# Patient Record
Sex: Female | Born: 1965 | Race: Black or African American | Hispanic: No | Marital: Single | Smoking: Current every day smoker
Health system: Southern US, Community
[De-identification: ages and names within clinical notes are randomized; demographics above are authoritative.]

## PROBLEM LIST (undated history)

## (undated) DIAGNOSIS — I10 Essential (primary) hypertension: Secondary | ICD-10-CM

## (undated) HISTORY — PX: TUBAL LIGATION: SHX77

---

## 2004-09-03 ENCOUNTER — Emergency Department: Payer: Self-pay | Admitting: Emergency Medicine

## 2004-10-25 ENCOUNTER — Emergency Department: Payer: Self-pay | Admitting: Unknown Physician Specialty

## 2005-03-01 ENCOUNTER — Emergency Department: Payer: Self-pay | Admitting: Emergency Medicine

## 2005-07-01 ENCOUNTER — Emergency Department: Payer: Self-pay | Admitting: Emergency Medicine

## 2005-10-24 ENCOUNTER — Emergency Department: Payer: Self-pay | Admitting: Emergency Medicine

## 2006-02-07 ENCOUNTER — Emergency Department: Payer: Self-pay | Admitting: Emergency Medicine

## 2006-11-20 ENCOUNTER — Emergency Department: Payer: Self-pay | Admitting: Emergency Medicine

## 2006-11-21 ENCOUNTER — Other Ambulatory Visit: Payer: Self-pay

## 2007-11-10 ENCOUNTER — Other Ambulatory Visit: Payer: Self-pay

## 2007-11-10 ENCOUNTER — Emergency Department: Payer: Self-pay | Admitting: Emergency Medicine

## 2008-10-19 ENCOUNTER — Emergency Department: Payer: Self-pay | Admitting: Emergency Medicine

## 2009-10-24 ENCOUNTER — Emergency Department: Payer: Self-pay | Admitting: Emergency Medicine

## 2010-03-12 ENCOUNTER — Emergency Department: Payer: Self-pay | Admitting: Emergency Medicine

## 2010-06-19 ENCOUNTER — Emergency Department: Payer: Self-pay | Admitting: Emergency Medicine

## 2010-10-18 ENCOUNTER — Emergency Department: Payer: Self-pay | Admitting: Emergency Medicine

## 2011-10-08 ENCOUNTER — Emergency Department: Payer: Self-pay | Admitting: Internal Medicine

## 2011-10-13 ENCOUNTER — Emergency Department: Payer: Self-pay | Admitting: Emergency Medicine

## 2011-12-24 ENCOUNTER — Emergency Department: Payer: Self-pay | Admitting: Internal Medicine

## 2012-04-14 ENCOUNTER — Emergency Department: Payer: Self-pay | Admitting: Internal Medicine

## 2013-01-18 ENCOUNTER — Emergency Department: Payer: Self-pay | Admitting: Emergency Medicine

## 2013-06-16 ENCOUNTER — Emergency Department: Payer: Self-pay | Admitting: Emergency Medicine

## 2013-06-16 LAB — RAPID INFLUENZA A&B ANTIGENS (ARMC ONLY)

## 2013-06-16 LAB — URINALYSIS, COMPLETE
BLOOD: NEGATIVE
Bacteria: NONE SEEN
Bilirubin,UR: NEGATIVE
Glucose,UR: NEGATIVE mg/dL (ref 0–75)
LEUKOCYTE ESTERASE: NEGATIVE
Nitrite: NEGATIVE
Ph: 5 (ref 4.5–8.0)
Protein: NEGATIVE
RBC,UR: 1 /HPF (ref 0–5)
SPECIFIC GRAVITY: 1.028 (ref 1.003–1.030)
Squamous Epithelial: 4
WBC UR: 3 /HPF (ref 0–5)

## 2014-02-12 ENCOUNTER — Emergency Department: Payer: Self-pay | Admitting: Internal Medicine

## 2014-02-22 ENCOUNTER — Emergency Department: Payer: Self-pay | Admitting: Emergency Medicine

## 2014-03-13 ENCOUNTER — Emergency Department: Payer: Self-pay | Admitting: Emergency Medicine

## 2014-03-13 LAB — BASIC METABOLIC PANEL
Anion Gap: 7 (ref 7–16)
BUN: 13 mg/dL (ref 7–18)
Calcium, Total: 8.5 mg/dL (ref 8.5–10.1)
Chloride: 105 mmol/L (ref 98–107)
Co2: 25 mmol/L (ref 21–32)
Creatinine: 1 mg/dL (ref 0.60–1.30)
EGFR (African American): >60
Glucose: 80 mg/dL (ref 65–99)
Osmolality: 273 (ref 275–301)
Potassium: 3.6 mmol/L (ref 3.5–5.1)
SODIUM: 137 mmol/L (ref 136–145)

## 2014-03-13 LAB — CBC
HCT: 36 % (ref 35.0–47.0)
HGB: 11 g/dL — ABNORMAL LOW (ref 12.0–16.0)
MCH: 25.3 pg — ABNORMAL LOW (ref 26.0–34.0)
MCHC: 30.7 g/dL — AB (ref 32.0–36.0)
MCV: 83 fL (ref 80–100)
Platelet: 207 10*3/uL (ref 150–440)
RBC: 4.36 10*6/uL (ref 3.80–5.20)
RDW: 14.9 % — ABNORMAL HIGH (ref 11.5–14.5)
WBC: 5.2 10*3/uL (ref 3.6–11.0)

## 2014-06-19 ENCOUNTER — Emergency Department: Payer: Self-pay | Admitting: Emergency Medicine

## 2014-06-19 LAB — URINALYSIS, COMPLETE
BILIRUBIN, UR: NEGATIVE
Bacteria: NONE SEEN
Blood: NEGATIVE
GLUCOSE, UR: NEGATIVE mg/dL (ref 0–75)
KETONE: NEGATIVE
Leukocyte Esterase: NEGATIVE
NITRITE: NEGATIVE
PROTEIN: NEGATIVE
Ph: 5 (ref 4.5–8.0)
RBC,UR: NONE SEEN /HPF (ref 0–5)
Specific Gravity: 1.016 (ref 1.003–1.030)
Squamous Epithelial: 1

## 2014-06-19 LAB — COMPREHENSIVE METABOLIC PANEL
ALBUMIN: 3.3 g/dL — AB (ref 3.4–5.0)
ALK PHOS: 161 U/L — AB
AST: 32 U/L (ref 15–37)
Anion Gap: 6 — ABNORMAL LOW (ref 7–16)
BUN: 12 mg/dL (ref 7–18)
Bilirubin,Total: 0.2 mg/dL (ref 0.2–1.0)
CALCIUM: 8.9 mg/dL (ref 8.5–10.1)
CHLORIDE: 104 mmol/L (ref 98–107)
Co2: 28 mmol/L (ref 21–32)
Creatinine: 0.96 mg/dL (ref 0.60–1.30)
EGFR (African American): >60
EGFR (Non-African Amer.): >60
Glucose: 92 mg/dL (ref 65–99)
Osmolality: 275 (ref 275–301)
Potassium: 3.8 mmol/L (ref 3.5–5.1)
SGPT (ALT): 41 U/L
Sodium: 138 mmol/L (ref 136–145)
TOTAL PROTEIN: 7.5 g/dL (ref 6.4–8.2)

## 2014-06-19 LAB — CBC
HCT: 38.6 % (ref 35.0–47.0)
HGB: 12.2 g/dL (ref 12.0–16.0)
MCH: 25.9 pg — ABNORMAL LOW (ref 26.0–34.0)
MCHC: 31.5 g/dL — ABNORMAL LOW (ref 32.0–36.0)
MCV: 82 fL (ref 80–100)
Platelet: 247 10*3/uL (ref 150–440)
RBC: 4.7 10*6/uL (ref 3.80–5.20)
RDW: 14.8 % — ABNORMAL HIGH (ref 11.5–14.5)
WBC: 5.9 10*3/uL (ref 3.6–11.0)

## 2014-06-19 LAB — PREGNANCY, URINE: Pregnancy Test, Urine: NEGATIVE m[IU]/mL

## 2014-06-19 LAB — LIPASE, BLOOD: Lipase: 216 U/L (ref 73–393)

## 2014-06-30 ENCOUNTER — Emergency Department: Payer: Self-pay | Admitting: Emergency Medicine

## 2015-01-08 ENCOUNTER — Emergency Department
Admission: EM | Admit: 2015-01-08 | Discharge: 2015-01-08 | Disposition: A | Discharge status: Home / Self Care | Payer: Self-pay | Attending: Emergency Medicine | Admitting: Emergency Medicine

## 2015-01-08 ENCOUNTER — Encounter: Payer: Self-pay | Admitting: Emergency Medicine

## 2015-01-08 DIAGNOSIS — R197 Diarrhea, unspecified: Secondary | ICD-10-CM

## 2015-01-08 DIAGNOSIS — Z3202 Encounter for pregnancy test, result negative: Secondary | ICD-10-CM | POA: Insufficient documentation

## 2015-01-08 DIAGNOSIS — Z720 Tobacco use: Secondary | ICD-10-CM | POA: Insufficient documentation

## 2015-01-08 DIAGNOSIS — R112 Nausea with vomiting, unspecified: Secondary | ICD-10-CM

## 2015-01-08 DIAGNOSIS — R1084 Generalized abdominal pain: Secondary | ICD-10-CM

## 2015-01-08 DIAGNOSIS — I10 Essential (primary) hypertension: Secondary | ICD-10-CM | POA: Insufficient documentation

## 2015-01-08 LAB — CBC WITH DIFFERENTIAL/PLATELET
Basophils Absolute: 0 10*3/uL (ref 0–0.1)
Basophils Relative: 1 %
EOS ABS: 0.1 10*3/uL (ref 0–0.7)
Eosinophils Relative: 3 %
HCT: 34.6 % — ABNORMAL LOW (ref 35.0–47.0)
HEMOGLOBIN: 11.2 g/dL — AB (ref 12.0–16.0)
LYMPHS PCT: 30 %
Lymphs Abs: 1.2 10*3/uL (ref 1.0–3.6)
MCH: 26.3 pg (ref 26.0–34.0)
MCHC: 32.5 g/dL (ref 32.0–36.0)
MCV: 80.9 fL (ref 80.0–100.0)
MONO ABS: 0.5 10*3/uL (ref 0.2–0.9)
Monocytes Relative: 13 %
NEUTROS PCT: 53 %
Neutro Abs: 2.1 10*3/uL (ref 1.4–6.5)
Platelets: 203 10*3/uL (ref 150–440)
RBC: 4.27 MIL/uL (ref 3.80–5.20)
RDW: 14.7 % — ABNORMAL HIGH (ref 11.5–14.5)
WBC: 4.1 10*3/uL (ref 3.6–11.0)

## 2015-01-08 LAB — URINALYSIS COMPLETE WITH MICROSCOPIC (ARMC ONLY)
BACTERIA UA: NONE SEEN
Bilirubin Urine: NEGATIVE
GLUCOSE, UA: NEGATIVE mg/dL
HGB URINE DIPSTICK: NEGATIVE
Ketones, ur: NEGATIVE mg/dL
Leukocytes, UA: NEGATIVE
NITRITE: NEGATIVE
PROTEIN: NEGATIVE mg/dL
Specific Gravity, Urine: 1.02 (ref 1.005–1.030)
pH: 5 (ref 5.0–8.0)

## 2015-01-08 LAB — POCT PREGNANCY, URINE: PREG TEST UR: NEGATIVE

## 2015-01-08 LAB — COMPREHENSIVE METABOLIC PANEL
ALK PHOS: 150 U/L — AB (ref 38–126)
ALT: 27 U/L (ref 14–54)
AST: 27 U/L (ref 15–41)
Albumin: 3.4 g/dL — ABNORMAL LOW (ref 3.5–5.0)
Anion gap: 5 (ref 5–15)
BILIRUBIN TOTAL: 0.3 mg/dL (ref 0.3–1.2)
BUN: 10 mg/dL (ref 6–20)
CHLORIDE: 106 mmol/L (ref 101–111)
CO2: 25 mmol/L (ref 22–32)
Calcium: 8.6 mg/dL — ABNORMAL LOW (ref 8.9–10.3)
Creatinine, Ser: 0.91 mg/dL (ref 0.44–1.00)
GFR calc non Af Amer: >60 mL/min (ref >60)
GLUCOSE: 95 mg/dL (ref 65–99)
Potassium: 3.8 mmol/L (ref 3.5–5.1)
SODIUM: 136 mmol/L (ref 135–145)
Total Protein: 7 g/dL (ref 6.5–8.1)

## 2015-01-08 MED ORDER — ONDANSETRON HCL 4 MG PO TABS: 4.0000 mg | ORAL_TABLET | Freq: Every day | ORAL | Status: DC | PRN

## 2015-01-08 MED ORDER — DICYCLOMINE HCL 20 MG PO TABS: 20.0000 mg | ORAL_TABLET | Freq: Three times a day (TID) | ORAL | Status: DC | PRN

## 2015-01-08 NOTE — Discharge Instructions (Signed)
Abdominal Pain °Many things can cause abdominal pain. Usually, abdominal pain is not caused by a disease and will improve without treatment. It can often be observed and treated at home. Your health care provider will do a physical exam and possibly order blood tests and X-rays to help determine the seriousness of your pain. However, in many cases, more time must pass before a clear cause of the pain can be found. Before that point, your health care provider may not know if you need more testing or further treatment. °HOME CARE INSTRUCTIONS  °Monitor your abdominal pain for any changes. The following actions may help to alleviate any discomfort you are experiencing: °· Only take over-the-counter or prescription medicines as directed by your health care provider. °· Do not take laxatives unless directed to do so by your health care provider. °· Try a clear liquid diet (broth, tea, or water) as directed by your health care provider. Slowly move to a bland diet as tolerated. °SEEK MEDICAL CARE IF: °· You have unexplained abdominal pain. °· You have abdominal pain associated with nausea or diarrhea. °· You have pain when you urinate or have a bowel movement. °· You experience abdominal pain that wakes you in the night. °· You have abdominal pain that is worsened or improved by eating food. °· You have abdominal pain that is worsened with eating fatty foods. °· You have a fever. °SEEK IMMEDIATE MEDICAL CARE IF:  °· Your pain does not go away within 2 hours. °· You keep throwing up (vomiting). °· Your pain is felt only in portions of the abdomen, such as the right side or the left lower portion of the abdomen. °· You pass bloody or black tarry stools. °MAKE SURE YOU: °· Understand these instructions.   °· Will watch your condition.   °· Will get help right away if you are not doing well or get worse.   °Document Released: 03/12/2005 Document Revised: 06/07/2013 Document Reviewed: 02/09/2013 °[employer]® Patient Information  ©2015 [employer]. This information is not intended to replace advice given to you by your health care provider. Make sure you discuss any questions you have with your health care provider. ° °Diarrhea °Diarrhea is frequent loose and watery bowel movements. It can cause you to feel weak and dehydrated. Dehydration can cause you to become tired and thirsty, have a dry mouth, and have decreased urination that often is dark yellow. Diarrhea is a sign of another problem, most often an infection that will not last long. In most cases, diarrhea typically lasts 2-3 days. However, it can last longer if it is a sign of something more serious. It is important to treat your diarrhea as directed by your caregiver to lessen or prevent future episodes of diarrhea. °CAUSES  °Some common causes include: °· Gastrointestinal infections caused by viruses, bacteria, or parasites. °· Food poisoning or food allergies. °· Certain medicines, such as antibiotics, chemotherapy, and laxatives. °· Artificial sweeteners and fructose. °· Digestive disorders. °HOME CARE INSTRUCTIONS °· Ensure adequate fluid intake (hydration): Have 1 cup (8 oz) of fluid for each diarrhea episode. Avoid fluids that contain simple sugars or sports drinks, fruit juices, whole milk products, and sodas. Your urine should be clear or pale yellow if you are drinking enough fluids. Hydrate with an oral rehydration solution that you can purchase at pharmacies, retail stores, and online. You can prepare an oral rehydration solution at home by mixing the following ingredients together: °·  - tsp table salt. °· ¾ tsp baking soda. °·    tsp salt substitute containing potassium chloride. °· 1  tablespoons sugar. °· 1 L (34 oz) of water. °· Certain foods and beverages may increase the speed at which food moves through the gastrointestinal (GI) tract. These foods and beverages should be avoided and include: °· Caffeinated and alcoholic beverages. °· High-fiber foods, such as raw  fruits and vegetables, nuts, seeds, and whole grain breads and cereals. °· Foods and beverages sweetened with sugar alcohols, such as xylitol, sorbitol, and mannitol. °· Some foods may be well tolerated and may help thicken stool including: °· Starchy foods, such as rice, toast, pasta, low-sugar cereal, oatmeal, grits, baked potatoes, crackers, and bagels. °· Bananas. °· Applesauce. °· Add probiotic-rich foods to help increase healthy bacteria in the GI tract, such as yogurt and fermented milk products. °· Wash your hands well after each diarrhea episode. °· Only take over-the-counter or prescription medicines as directed by your caregiver. °· Take a warm bath to relieve any burning or pain from frequent diarrhea episodes. °SEEK IMMEDIATE MEDICAL CARE IF:  °· You are unable to keep fluids down. °· You have persistent vomiting. °· You have blood in your stool, or your stools are black and tarry. °· You do not urinate in 6-8 hours, or there is only a small amount of very dark urine. °· You have abdominal pain that increases or localizes. °· You have weakness, dizziness, confusion, or light-headedness. °· You have a severe headache. °· Your diarrhea gets worse or does not get better. °· You have a fever or persistent symptoms for more than 2-3 days. °· You have a fever and your symptoms suddenly get worse. °MAKE SURE YOU:  °· Understand these instructions. °· Will watch your condition. °· Will get help right away if you are not doing well or get worse. °Document Released: 05/23/2002 Document Revised: 10/17/2013 Document Reviewed: 02/08/2012 °Exit[employer]® Patient Information ©2015 [employer]. This information is not intended to replace advice given to you by your health care provider. Make sure you discuss any questions you have with your health care provider. ° °Nausea and Vomiting °Nausea means you feel sick to your stomach. Throwing up (vomiting) is a reflex where stomach contents come out of your mouth. °HOME CARE    °· Take medicine as told by your doctor. °· Do not force yourself to eat. However, you do need to drink fluids. °· If you feel like eating, eat a normal diet as told by your doctor. °¨ Eat rice, wheat, potatoes, bread, lean meats, yogurt, fruits, and vegetables. °¨ Avoid high-fat foods. °· Drink enough fluids to keep your pee (urine) clear or pale yellow. °· Ask your doctor how to replace body fluid losses (rehydrate). Signs of body fluid loss (dehydration) include: °¨ Feeling very thirsty. °¨ Dry lips and mouth. °¨ Feeling dizzy. °¨ Dark pee. °¨ Peeing less than normal. °¨ Feeling confused. °¨ Fast breathing or heart rate. °GET HELP RIGHT AWAY IF:  °· You have blood in your throw up. °· You have black or bloody poop (stool). °· You have a bad headache or stiff neck. °· You feel confused. °· You have bad belly (abdominal) pain. °· You have chest pain or trouble breathing. °· You do not pee at least once every 8 hours. °· You have cold, clammy skin. °· You keep throwing up after 24 to 48 hours. °· You have a fever. °MAKE SURE YOU:  °· Understand these instructions. °· Will watch your condition. °· Will get help right away if you   are not doing well or get worse. °Document Released: 11/19/2007 Document Revised: 08/25/2011 Document Reviewed: 11/01/2010 °[employer]® Patient Information ©2015 [employer]. This information is not intended to replace advice given to you by your health care provider. Make sure you discuss any questions you have with your health care provider. ° °

## 2015-01-08 NOTE — ED Notes (Signed)

## 2015-01-08 NOTE — ED Notes (Signed)
Reports "eating some bad ranch dressing last night"  C/o diarrhea and nausea and abd cramps since.

## 2015-01-08 NOTE — ED Provider Notes (Addendum)
[facility] Emergency Department Provider Note  ____________________________________________  Time seen: Approximately 645 PM  I have reviewed the triage vital signs and the nursing notes.   HISTORY  Chief Complaint Abdominal Pain    HPI Debra Owen is a 49 y.o. female with a history of borderline hypertension who presents today with nausea vomiting diarrhea and abdominal cramping. She says that she had eaten a container of Ranch dressing last night that a bit in the refrigerator for 2 days. She said she got out of a restaurant. Nobody else at home at 8 in the [place] stressing. She said that she began having abdominal cramping. Had multiple episodes of vomiting. Also multiple episodes of diarrhea. Said that she had diffuse abdominal cramping with each episode of vomiting and diarrhea. Now she says that she is no longer having nausea vomiting and diarrhea. However, she says that she still has a lot of gas. Denies chest pain or shortness of breath. Says that she is concerned now because she feels weak. Also says that she is to work a double shift tonight and is concerned she will not be able to do so because of her weakness.   History reviewed. No pertinent past medical history.  There are no active problems to display for this patient.   History reviewed. No pertinent past surgical history.  No current outpatient prescriptions on file.  Allergies Review of patient's allergies indicates no known allergies.  History reviewed. No pertinent family history.  Social History History  Substance Use Topics  . Smoking status: Never Smoker   . Smokeless tobacco: Not on file  . Alcohol Use: No    Review of Systems Constitutional: No fever/chills Eyes: No visual changes. ENT: No sore throat. Cardiovascular: Denies chest pain. Respiratory: Denies shortness of breath. Gastrointestinal: No constipation. Genitourinary: Negative for dysuria. Musculoskeletal:  Negative for back pain. Skin: Negative for rash. Neurological: Negative for headaches, focal weakness or numbness.  10-point ROS otherwise negative.  ____________________________________________   PHYSICAL EXAM:  VITAL SIGNS: ED Triage Vitals  Enc Vitals Group     BP 01/08/15 1622 127/100 mmHg     Pulse Rate 01/08/15 1622 81     Resp 01/08/15 1622 20     Temp 01/08/15 1622 98.6 F (37 C)     Temp Source 01/08/15 1622 Oral     SpO2 01/08/15 1622 99 %     Weight 01/08/15 1622 300 lb (136.079 kg)     Height 01/08/15 1622 5\' 5"  (1.651 m)     Head Cir --      Peak Flow --      Pain Score 01/08/15 1623 4     Pain Loc --      Pain Edu? --      Excl. in GC? --     Constitutional: Alert and oriented. Well appearing and in no acute distress. Eyes: Conjunctivae are normal. PERRL. EOMI. Head: Atraumatic. Nose: No congestion/rhinnorhea. Mouth/Throat: Mucous membranes are moist.  Oropharynx non-erythematous. Neck: No stridor.   Cardiovascular: Normal rate, regular rhythm. Grossly normal heart sounds.  Good peripheral circulation. Respiratory: Normal respiratory effort.  No retractions. Lungs CTAB. Gastrointestinal: Soft with mild diffuse abdominal tenderness. No distention. No abdominal bruits. No CVA tenderness. Musculoskeletal: No lower extremity tenderness nor edema.  No joint effusions. Neurologic:  Normal speech and language. No gross focal neurologic deficits are appreciated. No gait instability. Skin:  Skin is warm, dry and intact. No rash noted. Psychiatric: Mood and affect are  normal. Speech and behavior are normal.  ____________________________________________   LABS (all labs ordered are listed, but only abnormal results are displayed)  Labs Reviewed  CBC WITH DIFFERENTIAL/PLATELET - Abnormal; Notable for the following:    Hemoglobin 11.2 (*)    HCT 34.6 (*)    RDW 14.7 (*)    All other components within normal limits  COMPREHENSIVE METABOLIC PANEL - Abnormal;  Notable for the following:    Calcium 8.6 (*)    Albumin 3.4 (*)    Alkaline Phosphatase 150 (*)    All other components within normal limits  URINALYSIS COMPLETEWITH MICROSCOPIC ([facility] ONLY) - Abnormal; Notable for the following:    Color, Urine YELLOW (*)    APPearance CLEAR (*)    Squamous Epithelial / LPF 0-5 (*)    All other components within normal limits  POCT PREGNANCY, URINE   ____________________________________________  EKG   ____________________________________________  RADIOLOGY   ____________________________________________   PROCEDURES   ____________________________________________   INITIAL IMPRESSION / ASSESSMENT AND PLAN / ED COURSE  Pertinent labs & imaging results that were available during my care of the patient were reviewed by me and considered in my medical decision making (see chart for details).  Patient with symptoms improving as far as her pain, nausea and vomiting and diarrhea. Feel that this may be an episode of food poisoning with improving symptoms. However, the patient does seem to be weak after this episode.  Despite this, she has reassuring labs. We'll discharge home with symptomatic treatment with Zofran and Bentyl. ____________________________________________   FINAL CLINICAL IMPRESSION(S) / ED DIAGNOSES  Acute abdominal pain with nausea vomiting and diarrhea. Initial visit.    Myrna Blazer, MD 01/08/15 [phone]  Patient knows to return for any worsening symptoms. However this time her symptoms are improving and her white count is normal. Will not proceed any further diagnostic imaging.  Myrna Blazer, MD 01/08/15 [phone]

## 2015-01-10 ENCOUNTER — Emergency Department
Admission: EM | Admit: 2015-01-10 | Discharge: 2015-01-10 | Disposition: A | Discharge status: Home / Self Care | Payer: Self-pay | Attending: Emergency Medicine | Admitting: Emergency Medicine

## 2015-01-10 ENCOUNTER — Encounter: Payer: Self-pay | Admitting: *Deleted

## 2015-01-10 DIAGNOSIS — K029 Dental caries, unspecified: Secondary | ICD-10-CM | POA: Insufficient documentation

## 2015-01-10 DIAGNOSIS — Z720 Tobacco use: Secondary | ICD-10-CM | POA: Insufficient documentation

## 2015-01-10 DIAGNOSIS — K088 Other specified disorders of teeth and supporting structures: Secondary | ICD-10-CM | POA: Insufficient documentation

## 2015-01-10 DIAGNOSIS — K0889 Other specified disorders of teeth and supporting structures: Secondary | ICD-10-CM

## 2015-01-10 DIAGNOSIS — I10 Essential (primary) hypertension: Secondary | ICD-10-CM | POA: Insufficient documentation

## 2015-01-10 HISTORY — DX: Essential (primary) hypertension: I10

## 2015-01-10 MED ORDER — IBUPROFEN 800 MG PO TABS: 800.0000 mg | ORAL_TABLET | Freq: Three times a day (TID) | ORAL | Status: DC | PRN

## 2015-01-10 MED ORDER — PENICILLIN V POTASSIUM 250 MG PO TABS: 250.0000 mg | ORAL_TABLET | Freq: Four times a day (QID) | ORAL | Status: DC

## 2015-01-10 NOTE — ED Notes (Signed)
Pt reports upper front dental pain for about 2 days, has been taking otc ibuprofen without relief.

## 2015-01-10 NOTE — ED Provider Notes (Signed)
[facility] Emergency Department Provider Note     Time seen: ----------------------------------------- 6:38 PM on 01/10/2015 -----------------------------------------    I have reviewed the triage vital signs and the nursing notes.   HISTORY  Chief Complaint Dental Pain    HPI BETTE BRIENZA is a 49 y.o. female who presents to ER for toothache for the last 2 days. Patient's been taking over-the-counter ibuprofen without relief. Patient states she's had a fever, denies other complaints.   Past Medical History  Diagnosis Date  . Hypertension     There are no active problems to display for this patient.   Past Surgical History  Procedure Laterality Date  . Cesarean section      Allergies Sulfa antibiotics  Social History History  Substance Use Topics  . Smoking status: Current Every Day Smoker  . Smokeless tobacco: Not on file  . Alcohol Use: No    Review of Systems Constitutional: Positive for fever Eyes: Negative for visual changes. ENT: Positive for toothache  ____________________________________________   PHYSICAL EXAM:  VITAL SIGNS: ED Triage Vitals  Enc Vitals Group     BP 01/10/15 1816 153/94 mmHg     Pulse --      Resp --      Temp 01/10/15 1816 97.7 F (36.5 C)     Temp Source 01/10/15 1815 Oral     SpO2 01/10/15 1816 95 %     Weight 01/10/15 1816 300 lb (136.079 kg)     Height 01/10/15 1816  (1.651 m)     Head Cir --      Peak Flow --      Pain Score 01/10/15 1818 7     Pain Loc --      Pain Edu? --      Excl. in GC? --     Constitutional: Alert and oriented. Well appearing and in no distress. Eyes: Conjunctivae are normal. PERRL. Normal extraocular movements. ENT   Head: Normocephalic and atraumatic.   Nose: No congestion/rhinnorhea.   Mouth/Throat: Mucous membranes are moist. Dental caries are present. Tooth #5 is tender   Neck: No stridor. Hematological/Lymphatic/Immunilogical: No  cervical lymphadenopathy.  ED COURSE:  Pertinent labs & imaging results that were available during my care of the patient were reviewed by me and considered in my medical decision making (see chart for details).  ___________________________________________  FINAL ASSESSMENT AND PLAN  Toothache  Plan: Patient with labs and imaging as dictated above. She'll be discharged with Pen-Vee K and Motrin to take at home. She is stable for discharge.   Emily Filbert, MD   Emily Filbert, MD 01/10/15 [phone]

## 2015-01-10 NOTE — Discharge Instructions (Signed)

## 2015-02-11 ENCOUNTER — Encounter: Payer: Self-pay | Admitting: Emergency Medicine

## 2015-02-11 ENCOUNTER — Emergency Department
Admission: EM | Admit: 2015-02-11 | Discharge: 2015-02-11 | Disposition: A | Discharge status: Home / Self Care | Payer: Self-pay | Attending: Emergency Medicine | Admitting: Emergency Medicine

## 2015-02-11 ENCOUNTER — Other Ambulatory Visit: Payer: Self-pay

## 2015-02-11 DIAGNOSIS — R51 Headache: Secondary | ICD-10-CM | POA: Insufficient documentation

## 2015-02-11 DIAGNOSIS — I10 Essential (primary) hypertension: Secondary | ICD-10-CM | POA: Insufficient documentation

## 2015-02-11 DIAGNOSIS — R519 Headache, unspecified: Secondary | ICD-10-CM

## 2015-02-11 DIAGNOSIS — R2243 Localized swelling, mass and lump, lower limb, bilateral: Secondary | ICD-10-CM | POA: Insufficient documentation

## 2015-02-11 DIAGNOSIS — R42 Dizziness and giddiness: Secondary | ICD-10-CM | POA: Insufficient documentation

## 2015-02-11 DIAGNOSIS — Z720 Tobacco use: Secondary | ICD-10-CM | POA: Insufficient documentation

## 2015-02-11 LAB — TROPONIN I: Troponin I: <0.03 ng/mL (ref <0.031)

## 2015-02-11 LAB — CBC
HCT: 34.2 % — ABNORMAL LOW (ref 35.0–47.0)
Hemoglobin: 11 g/dL — ABNORMAL LOW (ref 12.0–16.0)
MCH: 25.9 pg — AB (ref 26.0–34.0)
MCHC: 32.1 g/dL (ref 32.0–36.0)
MCV: 80.8 fL (ref 80.0–100.0)
Platelets: 219 10*3/uL (ref 150–440)
RBC: 4.23 MIL/uL (ref 3.80–5.20)
RDW: 14.8 % — AB (ref 11.5–14.5)
WBC: 5 10*3/uL (ref 3.6–11.0)

## 2015-02-11 LAB — BASIC METABOLIC PANEL
Anion gap: 10 (ref 5–15)
BUN: 18 mg/dL (ref 6–20)
CHLORIDE: 103 mmol/L (ref 101–111)
CO2: 23 mmol/L (ref 22–32)
CREATININE: 1.07 mg/dL — AB (ref 0.44–1.00)
Calcium: 9 mg/dL (ref 8.9–10.3)
GFR calc Af Amer: >60 mL/min (ref >60)
GFR calc non Af Amer: >60 mL/min (ref >60)
GLUCOSE: 166 mg/dL — AB (ref 65–99)
Potassium: 3.6 mmol/L (ref 3.5–5.1)
Sodium: 136 mmol/L (ref 135–145)

## 2015-02-11 LAB — GLUCOSE, CAPILLARY: Glucose-Capillary: 158 mg/dL — ABNORMAL HIGH (ref 65–99)

## 2015-02-11 MED ORDER — KETOROLAC TROMETHAMINE 30 MG/ML IJ SOLN
30.0000 mg | Freq: Once | INTRAMUSCULAR | Status: AC
  Administered 2015-02-11: 30 mg via INTRAVENOUS
  Filled 2015-02-11: qty 1

## 2015-02-11 MED ORDER — SODIUM CHLORIDE 0.9 % IV SOLN
Freq: Once | INTRAVENOUS | Status: AC
  Administered 2015-02-11: 19:00:00 via INTRAVENOUS

## 2015-02-11 MED ORDER — METOCLOPRAMIDE HCL 5 MG/ML IJ SOLN
10.0000 mg | Freq: Once | INTRAMUSCULAR | Status: AC
  Administered 2015-02-11: 10 mg via INTRAVENOUS
  Filled 2015-02-11: qty 2

## 2015-02-11 MED ORDER — DIPHENHYDRAMINE HCL 50 MG/ML IJ SOLN
25.0000 mg | Freq: Once | INTRAMUSCULAR | Status: AC
  Administered 2015-02-11: 25 mg via INTRAVENOUS
  Filled 2015-02-11: qty 1

## 2015-02-11 NOTE — Discharge Instructions (Signed)
Dizziness °Dizziness is a common problem. It is a feeling of unsteadiness or light-headedness. You may feel like you are about to faint. Dizziness can lead to injury if you stumble or fall. A person of any age group can suffer from dizziness, but dizziness is more common in older adults. °CAUSES  °Dizziness can be caused by many different things, including: °· Middle ear problems. °· Standing for too long. °· Infections. °· An allergic reaction. °· Aging. °· An emotional response to something, such as the sight of blood. °· Side effects of medicines. °· Tiredness. °· Problems with circulation or blood pressure. °· Excessive use of alcohol or medicines, or illegal drug use. °· Breathing too fast (hyperventilation). °· An irregular heart rhythm (arrhythmia). °· A low red blood cell count (anemia). °· Pregnancy. °· Vomiting, diarrhea, fever, or other illnesses that cause body fluid loss (dehydration). °· Diseases or conditions such as Parkinson's disease, high blood pressure (hypertension), diabetes, and thyroid problems. °· Exposure to extreme heat. °DIAGNOSIS  °Your health care provider will ask about your symptoms, perform a physical exam, and perform an electrocardiogram (ECG) to record the electrical activity of your heart. Your health care provider may also perform other heart or blood tests to determine the cause of your dizziness. These may include: °· Transthoracic echocardiogram (TTE). During echocardiography, sound waves are used to evaluate how blood flows through your heart. °· Transesophageal echocardiogram (TEE). °· Cardiac monitoring. This allows your health care provider to monitor your heart rate and rhythm in real time. °· Holter monitor. This is a portable device that records your heartbeat and can help diagnose heart arrhythmias. It allows your health care provider to track your heart activity for several days if needed. °· Stress tests by exercise or by giving medicine that makes the heart beat  faster. °TREATMENT  °Treatment of dizziness depends on the cause of your symptoms and can vary greatly. °HOME CARE INSTRUCTIONS  °· Drink enough fluids to keep your urine clear or pale yellow. This is especially important in very hot weather. In older adults, it is also important in cold weather. °· Take your medicine exactly as directed if your dizziness is caused by medicines. When taking blood pressure medicines, it is especially important to get up slowly. °· Rise slowly from chairs and steady yourself until you feel okay. °· In the morning, first sit up on the side of the bed. When you feel okay, stand slowly while holding onto something until you know your balance is fine. °· Move your legs often if you need to stand in one place for a long time. Tighten and relax your muscles in your legs while standing. °· Have someone stay with you for 1-2 days if dizziness continues to be a problem. Do this until you feel you are well enough to stay alone. Have the person call your health care provider if he or she notices changes in you that are concerning. °· Do not drive or use heavy machinery if you feel dizzy. °· Do not drink alcohol. °SEEK IMMEDIATE MEDICAL CARE IF:  °· Your dizziness or light-headedness gets worse. °· You feel nauseous or vomit. °· You have problems talking, walking, or using your arms, hands, or legs. °· You feel weak. °· You are not thinking clearly or you have trouble forming sentences. It may take a friend or family member to notice this. °· You have chest pain, abdominal pain, shortness of breath, or sweating. °· Your vision changes. °· You notice   any bleeding. °· You have side effects from medicine that seems to be getting worse rather than better. °MAKE SURE YOU:  °· Understand these instructions. °· Will watch your condition. °· Will get help right away if you are not doing well or get worse. °Document Released: 11/26/2000 Document Revised: 06/07/2013 Document Reviewed: 12/20/2010 °[employer]®  Patient Information ©2015 [employer]. This information is not intended to replace advice given to you by your health care provider. Make sure you discuss any questions you have with your health care provider. ° °General Headache Without Cause °A general headache is pain or discomfort felt around the head or neck area. The cause may not be found.  °HOME CARE  °· Keep all doctor visits. °· Only take medicines as told by your doctor. °· Lie down in a dark, quiet room when you have a headache. °· Keep a journal to find out if certain things bring on headaches. For example, write down: °¨ What you eat and drink. °¨ How much sleep you get. °¨ Any change to your diet or medicines. °· Relax by getting a massage or doing other relaxing activities. °· Put ice or heat packs on the head and neck area as told by your doctor. °· Lessen stress. °· Sit up straight. Do not tighten (tense) your muscles. °· Quit smoking if you smoke. °· Lessen how much alcohol you drink. °· Lessen how much caffeine you drink, or stop drinking caffeine. °· Eat and sleep on a regular schedule. °· Get 7 to 9 hours of sleep, or as told by your doctor. °· Keep lights dim if bright lights bother you or make your headaches worse. °GET HELP RIGHT AWAY IF:  °· Your headache becomes really bad. °· You have a fever. °· You have a stiff neck. °· You have trouble seeing. °· Your muscles are weak, or you lose muscle control. °· You lose your balance or have trouble walking. °· You feel like you will pass out (faint), or you pass out. °· You have really bad symptoms that are different than your first symptoms. °· You have problems with the medicines given to you by your doctor. °· Your medicines do not work. °· Your headache feels different than the other headaches. °· You feel sick to your stomach (nauseous) or throw up (vomit). °MAKE SURE YOU:  °· Understand these instructions. °· Will watch your condition. °· Will get help right away if you are not doing well  or get worse. °Document Released: 03/11/2008 Document Revised: 08/25/2011 Document Reviewed: 05/23/2011 °[employer]® Patient Information ©2015 [employer]. This information is not intended to replace advice given to you by your health care provider. Make sure you discuss any questions you have with your health care provider. ° °

## 2015-02-11 NOTE — ED Provider Notes (Signed)
[facility] Emergency Department Provider Note     Time seen: ----------------------------------------- 6:02 PM on 02/11/2015 -----------------------------------------    I have reviewed the triage vital signs and the nursing notes.   HISTORY  Chief Complaint Near Syncope    HPI Debra Owen is a 49 y.o. female presents ER after waking this morning about 10 AM with a pounding headache. Patient states she woke up about 1:00 in her left hand was numb and she also felt dizzy. She notes any symptoms at this time. Also reports swelling in her legs to just started today, notes she has not had her blood pressure medications in 3 weeks.Currently she still has a mild headache that the left eye, states she's been working a lot and working double shifts. She has no numbness currently, feels like her legs are swollen for no reason.   Past Medical History  Diagnosis Date  . Hypertension     There are no active problems to display for this patient.   Past Surgical History  Procedure Laterality Date  . Cesarean section      Allergies Morphine and related and Sulfa antibiotics  Social History Social History  Substance Use Topics  . Smoking status: Current Every Day Smoker    Types: Cigarettes  . Smokeless tobacco: Never Used  . Alcohol Use: No    Review of Systems Constitutional: Negative for fever. Eyes: Negative for visual changes. ENT: Negative for sore throat. Cardiovascular: Negative for chest pain. Respiratory: Negative for shortness of breath. Gastrointestinal: Negative for abdominal pain, vomiting and diarrhea. Genitourinary: Negative for dysuria. Musculoskeletal: Negative for back pain. Positive for lower extremity swelling Skin: Negative for rash. Neurological: Positive for headache, negative for focal weakness or numbness.  10-point ROS otherwise negative.  ____________________________________________   PHYSICAL EXAM:  VITAL  SIGNS: ED Triage Vitals  Enc Vitals Group     BP 02/11/15 1645 112/83 mmHg     Pulse Rate 02/11/15 1645 102     Resp 02/11/15 1645 18     Temp 02/11/15 1645 98.6 F (37 C)     Temp Source 02/11/15 1645 Oral     SpO2 02/11/15 1645 98 %     Weight 02/11/15 1645 300 lb (136.079 kg)     Height 02/11/15 1645 5\' 5"  (1.651 m)     Head Cir --      Peak Flow --      Pain Score --      Pain Loc --      Pain Edu? --      Excl. in GC? --     Constitutional: Alert and oriented. Well appearing and in no distress. Eyes: Conjunctivae are normal. PERRL. Normal extraocular movements. Funduscopic exam appears normal. ENT   Head: Normocephalic and atraumatic.   Nose: No congestion/rhinnorhea.   Mouth/Throat: Mucous membranes are moist.   Neck: No stridor. Cardiovascular: Normal rate, regular rhythm. Normal and symmetric distal pulses are present in all extremities. No murmurs, rubs, or gallops. Respiratory: Normal respiratory effort without tachypnea nor retractions. Breath sounds are clear and equal bilaterally. No wheezes/rales/rhonchi. Gastrointestinal: Soft and nontender. No distention. No abdominal bruits.  Musculoskeletal: Nontender with normal range of motion in all extremities. No joint effusions.  No lower extremity tenderness nor edema. Neurologic:  Normal speech and language. No gross focal neurologic deficits are appreciated. Speech is normal. No gait instability. Skin:  Skin is warm, dry and intact. No rash noted. Psychiatric: Mood and affect are normal. Speech and  behavior are normal. Patient exhibits appropriate insight and judgment. ____________________________________________  EKG: Interpreted by me. Normal sinus rhythm with a rate of 97 bpm, normal PR interval, normal euros with, normal QT interval.  ____________________________________________  ED COURSE:  Pertinent labs & imaging results that were available during my care of the patient were reviewed by me and  considered in my medical decision making (see chart for details). Patient is in no acute distress, will check basic labs and give IV pain medication. ____________________________________________    LABS (pertinent positives/negatives)  Labs Reviewed  BASIC METABOLIC PANEL - Abnormal; Notable for the following:    Glucose, Bld 166 (*)    Creatinine, Ser 1.07 (*)    All other components within normal limits  CBC - Abnormal; Notable for the following:    Hemoglobin 11.0 (*)    HCT 34.2 (*)    MCH 25.9 (*)    RDW 14.8 (*)    All other components within normal limits  TROPONIN I  URINALYSIS COMPLETEWITH MICROSCOPIC ([facility] ONLY)  CBG MONITORING, ED   ____________________________________________  FINAL ASSESSMENT AND PLAN  Headache, edema  Plan: Patient with labs and imaging as dictated above. No clear etiology for her symptoms. I feel she is just exhausted and working too much without adequate sleep. She'll be discharged with close follow-up with her doctor. She'll be given a work for the next 48 hours.   Emily Filbert, MD   Emily Filbert, MD 02/11/15 Norberta Keens

## 2015-02-11 NOTE — ED Notes (Signed)
Pt still unable to give urine sample = ok per dr to give medication

## 2015-02-11 NOTE — ED Notes (Signed)
Pt reports waking up this morning about 10am with "pounding headache" Then pt states she woke up at about 1300 and her left hand was numb and dizzy. Denies numbness in left hand also denies headache at this time. Pt also reports swelling in legs that just started today. States she has not had blood pressure medication in 3 weeks.

## 2015-11-14 ENCOUNTER — Emergency Department
Admission: EM | Admit: 2015-11-14 | Discharge: 2015-11-14 | Disposition: A | Discharge status: Home / Self Care | Payer: Self-pay | Attending: Emergency Medicine | Admitting: Emergency Medicine

## 2015-11-14 ENCOUNTER — Emergency Department: Payer: Self-pay

## 2015-11-14 ENCOUNTER — Encounter: Payer: Self-pay | Admitting: Emergency Medicine

## 2015-11-14 DIAGNOSIS — I10 Essential (primary) hypertension: Secondary | ICD-10-CM | POA: Insufficient documentation

## 2015-11-14 DIAGNOSIS — F17210 Nicotine dependence, cigarettes, uncomplicated: Secondary | ICD-10-CM | POA: Insufficient documentation

## 2015-11-14 DIAGNOSIS — R42 Dizziness and giddiness: Secondary | ICD-10-CM | POA: Insufficient documentation

## 2015-11-14 LAB — BASIC METABOLIC PANEL
ANION GAP: 6 (ref 5–15)
BUN: 11 mg/dL (ref 6–20)
CALCIUM: 9 mg/dL (ref 8.9–10.3)
CHLORIDE: 105 mmol/L (ref 101–111)
CO2: 26 mmol/L (ref 22–32)
Creatinine, Ser: 1 mg/dL (ref 0.44–1.00)
GFR calc Af Amer: >60 mL/min (ref >60)
GFR calc non Af Amer: >60 mL/min (ref >60)
GLUCOSE: 122 mg/dL — AB (ref 65–99)
Potassium: 3.8 mmol/L (ref 3.5–5.1)
Sodium: 137 mmol/L (ref 135–145)

## 2015-11-14 LAB — GLUCOSE, CAPILLARY: Glucose-Capillary: 110 mg/dL — ABNORMAL HIGH (ref 65–99)

## 2015-11-14 LAB — URINALYSIS COMPLETE WITH MICROSCOPIC (ARMC ONLY)
Bilirubin Urine: NEGATIVE
Glucose, UA: NEGATIVE mg/dL
HGB URINE DIPSTICK: NEGATIVE
KETONES UR: NEGATIVE mg/dL
LEUKOCYTES UA: NEGATIVE
NITRITE: NEGATIVE
PH: 7 (ref 5.0–8.0)
PROTEIN: NEGATIVE mg/dL
SPECIFIC GRAVITY, URINE: 1.011 (ref 1.005–1.030)

## 2015-11-14 LAB — POCT PREGNANCY, URINE: Preg Test, Ur: NEGATIVE

## 2015-11-14 LAB — CBC
HEMATOCRIT: 35.1 % (ref 35.0–47.0)
HEMOGLOBIN: 11.5 g/dL — AB (ref 12.0–16.0)
MCH: 26.7 pg (ref 26.0–34.0)
MCHC: 32.8 g/dL (ref 32.0–36.0)
MCV: 81.3 fL (ref 80.0–100.0)
Platelets: 225 10*3/uL (ref 150–440)
RBC: 4.31 MIL/uL (ref 3.80–5.20)
RDW: 14 % (ref 11.5–14.5)
WBC: 5.5 10*3/uL (ref 3.6–11.0)

## 2015-11-14 MED ORDER — MECLIZINE HCL 25 MG PO TABS: 50.0000 mg | ORAL_TABLET | Freq: Three times a day (TID) | ORAL | Status: DC | PRN

## 2015-11-14 MED ORDER — HYDROCHLOROTHIAZIDE 25 MG PO TABS: 25.0000 mg | ORAL_TABLET | Freq: Every day | ORAL | Status: DC

## 2015-11-14 MED ORDER — MECLIZINE HCL 25 MG PO TABS
50.0000 mg | ORAL_TABLET | Freq: Once | ORAL | Status: AC
  Administered 2015-11-14: 50 mg via ORAL

## 2015-11-14 NOTE — ED Notes (Signed)
Pt reports has not taken HCTZ for HTN since September 2016, states ran out of medication and was told she must have physical to have med refilled. Pt has not been for her physical.

## 2015-11-14 NOTE — Discharge Instructions (Signed)
Benign Positional Vertigo Vertigo is the feeling that you or your surroundings are moving when they are not. Benign positional vertigo is the most common form of vertigo. The cause of this condition is not serious (is benign). This condition is triggered by certain movements and positions (is positional). This condition can be dangerous if it occurs while you are doing something that could endanger you or others, such as driving.  CAUSES In many cases, the cause of this condition is not known. It may be caused by a disturbance in an area of the inner ear that helps your brain to sense movement and balance. This disturbance can be caused by a viral infection (labyrinthitis), head injury, or repetitive motion. RISK FACTORS This condition is more likely to develop in:  Women.  People who are 50 years of age or older. SYMPTOMS Symptoms of this condition usually happen when you move your head or your eyes in different directions. Symptoms may start suddenly, and they usually last for less than a minute. Symptoms may include:  Loss of balance and falling.  Feeling like you are spinning or moving.  Feeling like your surroundings are spinning or moving.  Nausea and vomiting.  Blurred vision.  Dizziness.  Involuntary eye movement (nystagmus). Symptoms can be mild and cause only slight annoyance, or they can be severe and interfere with daily life. Episodes of benign positional vertigo may return (recur) over time, and they may be triggered by certain movements. Symptoms may improve over time. DIAGNOSIS This condition is usually diagnosed by medical history and a physical exam of the head, neck, and ears. You may be referred to a health care provider who specializes in ear, nose, and throat (ENT) problems (otolaryngologist) or a provider who specializes in disorders of the nervous system (neurologist). You may have additional testing, including:  MRI.  A CT scan.  Eye movement tests. Your  health care provider may ask you to change positions quickly while he or she watches you for symptoms of benign positional vertigo, such as nystagmus. Eye movement may be tested with an electronystagmogram (ENG), caloric stimulation, the Dix-Hallpike test, or the roll test.  An electroencephalogram (EEG). This records electrical activity in your brain.  Hearing tests. TREATMENT Usually, your health care provider will treat this by moving your head in specific positions to adjust your inner ear back to normal. Surgery may be needed in severe cases, but this is rare. In some cases, benign positional vertigo may resolve on its own in 2-4 weeks. HOME CARE INSTRUCTIONS Safety  Move slowly.Avoid sudden body or head movements.  Avoid driving.  Avoid operating heavy machinery.  Avoid doing any tasks that would be dangerous to you or others if a vertigo episode would occur.  If you have trouble walking or keeping your balance, try using a cane for stability. If you feel dizzy or unstable, sit down right away.  Return to your normal activities as told by your health care provider. Ask your health care provider what activities are safe for you. General Instructions  Take over-the-counter and prescription medicines only as told by your health care provider.  Avoid certain positions or movements as told by your health care provider.  Drink enough fluid to keep your urine clear or pale yellow.  Keep all follow-up visits as told by your health care provider. This is important. SEEK MEDICAL CARE IF:  You have a fever.  Your condition gets worse or you develop new symptoms.  Your family or friends   notice any behavioral changes.  Your nausea or vomiting gets worse.  You have numbness or a "pins and needles" sensation. SEEK IMMEDIATE MEDICAL CARE IF:  You have difficulty speaking or moving.  You are always dizzy.  You faint.  You develop severe headaches.  You have weakness in your  legs or arms.  You have changes in your hearing or vision.  You develop a stiff neck.  You develop sensitivity to light.   This information is not intended to replace advice given to you by your health care provider. Make sure you discuss any questions you have with your health care provider.   Document Released: 03/10/2006 Document Revised: 02/21/2015 Document Reviewed: 09/25/2014 Elsevier Interactive Patient Education 2016 [employer]  

## 2015-11-14 NOTE — ED Provider Notes (Signed)
[facility] Emergency Department Provider Note  ____________________________________________  Time seen: 4:00 AM  I have reviewed the triage vital signs and the nursing notes.   HISTORY  Chief Complaint Dizziness     HPI Debra Owen is a 50 y.o. female with history of hypertension presents with intermittent positional dizziness for 2 weeks. Patient states that she feels as though the room is spinning when she moves her head or goes from a seated to a standing position. Patient denies any headache no weakness numbness or gait instability and no nausea or vomiting. Patient denies any chest pain or shortness of breath. A potential significance of patient recently started a over-the-counter diuretic 4 days ago secondary to lower extremity swelling.     Past Medical History  Diagnosis Date  . Hypertension     There are no active problems to display for this patient.   Past Surgical History  Procedure Laterality Date  . Cesarean section      Current Outpatient Rx  Name  Route  Sig  Dispense  Refill  . dicyclomine (BENTYL) 20 MG tablet   Oral   Take 1 tablet (20 mg total) by mouth 3 (three) times daily as needed for spasms.   30 tablet   0   . hydrochlorothiazide (HYDRODIURIL) 25 MG tablet   Oral   Take 25 mg by mouth daily.         Marland Kitchen ibuprofen (ADVIL,MOTRIN) 800 MG tablet   Oral   Take 1 tablet (800 mg total) by mouth every 8 (eight) hours as needed.   30 tablet   0   . ondansetron (ZOFRAN) 4 MG tablet   Oral   Take 1 tablet (4 mg total) by mouth daily as needed for nausea or vomiting.   10 tablet   1   . penicillin v potassium (VEETID) 250 MG tablet   Oral   Take 1 tablet (250 mg total) by mouth 4 (four) times daily.   40 tablet   0     Allergies Morphine and related and Sulfa antibiotics  No family history on file.  Social History Social History  Substance Use Topics  . Smoking status: Current Every Day Smoker   Types: Cigarettes  . Smokeless tobacco: Never Used  . Alcohol Use: No    Review of Systems  Constitutional: Negative for fever. Eyes: Negative for visual changes. ENT: Negative for sore throat. Cardiovascular: Negative for chest pain. Respiratory: Negative for shortness of breath. Gastrointestinal: Negative for abdominal pain, vomiting and diarrhea. Genitourinary: Negative for dysuria. Musculoskeletal: Negative for back pain. Skin: Negative for rash. Neurological: Positive for dizziness   10-point ROS otherwise negative.  ____________________________________________   PHYSICAL EXAM:  VITAL SIGNS: ED Triage Vitals  Enc Vitals Group     BP 11/14/15 0100 156/86 mmHg     Pulse Rate 11/14/15 0100 78     Resp 11/14/15 0100 18     Temp 11/14/15 0100 98.8 F (37.1 C)     Temp Source 11/14/15 0100 Oral     SpO2 11/14/15 0100 100 %     Weight 11/14/15 0100 280 lb (127.007 kg)     Height 11/14/15 0100  (1.651 m)     Head Cir --      Peak Flow --      Pain Score 11/14/15 0127 0     Pain Loc --      Pain Edu? --      Excl. in GC? --  Constitutional: Alert and oriented. Well appearing and in no distress. Eyes: Conjunctivae are normal. PERRL. Normal extraocular movements. ENT   Head: Normocephalic and atraumatic.   Nose: No congestion/rhinnorhea.   Mouth/Throat: Mucous membranes are moist.   Neck: No stridor. Hematological/Lymphatic/Immunilogical: No cervical lymphadenopathy. Cardiovascular: Normal rate, regular rhythm. Normal and symmetric distal pulses are present in all extremities. No murmurs, rubs, or gallops. Respiratory: Normal respiratory effort without tachypnea nor retractions. Breath sounds are clear and equal bilaterally. No wheezes/rales/rhonchi. Gastrointestinal: Soft and nontender. No distention. There is no CVA tenderness. Genitourinary: deferred Musculoskeletal: Nontender with normal range of motion in all extremities. No joint  effusions.  No lower extremity tenderness nor edema. Neurologic:  Normal speech and language. No gross focal neurologic deficits are appreciated. Speech is normal.  Skin:  Skin is warm, dry and intact. No rash noted. Psychiatric: Mood and affect are normal. Speech and behavior are normal. Patient exhibits appropriate insight and judgment.  ____________________________________________    LABS (pertinent positives/negatives)  Labs Reviewed  BASIC METABOLIC PANEL - Abnormal; Notable for the following:    Glucose, Bld 122 (*)    All other components within normal limits  CBC - Abnormal; Notable for the following:    Hemoglobin 11.5 (*)    All other components within normal limits  URINALYSIS COMPLETEWITH MICROSCOPIC ([facility] ONLY) - Abnormal; Notable for the following:    Color, Urine STRAW (*)    APPearance CLEAR (*)    Bacteria, UA RARE (*)    Squamous Epithelial / LPF 0-5 (*)    All other components within normal limits  GLUCOSE, CAPILLARY - Abnormal; Notable for the following:    Glucose-Capillary 110 (*)    All other components within normal limits  CBG MONITORING, ED  POC URINE PREG, ED  POCT PREGNANCY, URINE     ____________________________________________   EKG  ED ECG REPORT I, [place] N Ashya Nicolaisen, the attending physician, personally viewed and interpreted this ECG.   Date: 11/14/2015  EKG Time: 1:16 AM  Rate: 74  Rhythm: Normal sinus rhythm  Axis: Normal  Intervals: Normal  ST&T Change: None   ____________________________________________    RADIOLOGY   CT Head Wo Contrast (Final result) Result time: 11/14/15 06:53:00   Final result by Rad Results In Interface (11/14/15 06:53:00)   Narrative:   CLINICAL DATA: Dizziness.  EXAM: CT HEAD WITHOUT CONTRAST  TECHNIQUE: Contiguous axial images were obtained from the base of the skull through the vertex without intravenous contrast.  COMPARISON: 11/21/2006  FINDINGS: No intracranial hemorrhage, mass  effect, or midline shift. No hydrocephalus. The basilar cisterns are patent. No evidence of territorial infarct. No intracranial fluid collection. Calvarium is intact. Included paranasal sinuses and mastoid air cells are well aerated.  IMPRESSION: No acute intracranial abnormality.   Electronically Signed By: Rubye OaksMelanie Ehinger M.D. On: 11/14/2015 06:53              INITIAL IMPRESSION / ASSESSMENT AND PLAN / ED COURSE  Pertinent labs & imaging results that were available during my care of the patient were reviewed by me and considered in my medical decision making (see chart for details).  Patient received meclizine emergency Department with improvement of symptoms  ____________________________________________   FINAL CLINICAL IMPRESSION(S) / ED DIAGNOSES  Final diagnoses:  Vertigo      Darci Currentandolph N Shalimar Mcclain, MD 11/15/15 [phone]

## 2015-11-14 NOTE — ED Notes (Signed)
Pt went to CT

## 2015-11-14 NOTE — ED Notes (Signed)
Pt presents to ED with c/o dizziness when changing positions from laying to sitting x 2 weeks and weakness since Saturday . Reports she noticed bilateral ankle swelling since Saturday and has been taking OTC fluid pills (Diurex). Reports has been having increase weakness since taking fluid pills. Pt denies chest pain, shortness of breath, or abdominal pain. Pt alert and oriented x 4, respirations even and unlabored, skin warm and dry.

## 2015-11-14 NOTE — ED Notes (Signed)
Pt states that she has been experiencing dizziness. Pt reports taking "over the counter diuretics" for ankle swelling; which left her "really weak."

## 2016-03-09 ENCOUNTER — Encounter: Payer: Self-pay | Admitting: Emergency Medicine

## 2016-03-09 ENCOUNTER — Emergency Department
Admission: EM | Admit: 2016-03-09 | Discharge: 2016-03-09 | Disposition: A | Discharge status: Home / Self Care | Payer: Self-pay | Attending: Emergency Medicine | Admitting: Emergency Medicine

## 2016-03-09 ENCOUNTER — Emergency Department: Payer: Self-pay

## 2016-03-09 DIAGNOSIS — I10 Essential (primary) hypertension: Secondary | ICD-10-CM | POA: Insufficient documentation

## 2016-03-09 DIAGNOSIS — Z79899 Other long term (current) drug therapy: Secondary | ICD-10-CM | POA: Insufficient documentation

## 2016-03-09 DIAGNOSIS — Z791 Long term (current) use of non-steroidal anti-inflammatories (NSAID): Secondary | ICD-10-CM | POA: Insufficient documentation

## 2016-03-09 DIAGNOSIS — Z792 Long term (current) use of antibiotics: Secondary | ICD-10-CM | POA: Insufficient documentation

## 2016-03-09 DIAGNOSIS — M79604 Pain in right leg: Secondary | ICD-10-CM | POA: Insufficient documentation

## 2016-03-09 DIAGNOSIS — F17210 Nicotine dependence, cigarettes, uncomplicated: Secondary | ICD-10-CM | POA: Insufficient documentation

## 2016-03-09 LAB — CBC
HEMATOCRIT: 35.8 % (ref 35.0–47.0)
HEMOGLOBIN: 11.7 g/dL — AB (ref 12.0–16.0)
MCH: 26.1 pg (ref 26.0–34.0)
MCHC: 32.8 g/dL (ref 32.0–36.0)
MCV: 79.6 fL — ABNORMAL LOW (ref 80.0–100.0)
Platelets: 228 10*3/uL (ref 150–440)
RBC: 4.5 MIL/uL (ref 3.80–5.20)
RDW: 15 % — ABNORMAL HIGH (ref 11.5–14.5)
WBC: 4.5 10*3/uL (ref 3.6–11.0)

## 2016-03-09 LAB — BASIC METABOLIC PANEL
ANION GAP: 5 (ref 5–15)
BUN: 13 mg/dL (ref 6–20)
CALCIUM: 8.9 mg/dL (ref 8.9–10.3)
CHLORIDE: 103 mmol/L (ref 101–111)
CO2: 27 mmol/L (ref 22–32)
Creatinine, Ser: 1.16 mg/dL — ABNORMAL HIGH (ref 0.44–1.00)
GFR calc non Af Amer: 55 mL/min — ABNORMAL LOW (ref >60)
Glucose, Bld: 126 mg/dL — ABNORMAL HIGH (ref 65–99)
Potassium: 3.6 mmol/L (ref 3.5–5.1)
SODIUM: 135 mmol/L (ref 135–145)

## 2016-03-09 LAB — FIBRIN DERIVATIVES D-DIMER (ARMC ONLY): FIBRIN DERIVATIVES D-DIMER (ARMC): 592 — AB (ref 0–499)

## 2016-03-09 MED ORDER — OXYCODONE-ACETAMINOPHEN 5-325 MG PO TABS
2.0000 | ORAL_TABLET | Freq: Once | ORAL | Status: AC
  Administered 2016-03-09: 2 via ORAL

## 2016-03-09 MED ORDER — DIAZEPAM 5 MG PO TABS: 5.0000 mg | ORAL_TABLET | Freq: Three times a day (TID) | ORAL | 0 refills | Status: DC | PRN

## 2016-03-09 MED ORDER — ONDANSETRON 4 MG PO TBDP
ORAL_TABLET | ORAL | Status: AC
  Filled 2016-03-09: qty 1

## 2016-03-09 MED ORDER — OXYCODONE-ACETAMINOPHEN 5-325 MG PO TABS
ORAL_TABLET | ORAL | Status: AC
  Filled 2016-03-09: qty 2

## 2016-03-09 MED ORDER — ONDANSETRON 4 MG PO TBDP
4.0000 mg | ORAL_TABLET | Freq: Once | ORAL | Status: AC
  Administered 2016-03-09: 4 mg via ORAL

## 2016-03-09 MED ORDER — TRAMADOL HCL 50 MG PO TABS: 50.0000 mg | ORAL_TABLET | Freq: Four times a day (QID) | ORAL | 0 refills | Status: AC | PRN

## 2016-03-09 NOTE — ED Provider Notes (Signed)
[facility] Emergency Department Provider Note        Time seen: ----------------------------------------- 5:06 PM on 03/09/2016 -----------------------------------------    I have reviewed the triage vital signs and the nursing notes.   HISTORY  Chief Complaint Groin Pain    HPI Debra Owen is a 50 y.o. female who presents to ER for right groin pain on and off for 3 weeks. Patient thought her blood pressure might be high so she wanted to have that evaluation as well. She states external rotation or abduction of the right hip makes her symptoms worse.Patient is not aware of any recent injury   Past Medical History:  Diagnosis Date  . Hypertension     There are no active problems to display for this patient.   Past Surgical History:  Procedure Laterality Date  . CESAREAN SECTION      Allergies Morphine and related and Sulfa antibiotics  Social History Social History  Substance Use Topics  . Smoking status: Current Every Day Smoker    Packs/day: 0.25    Types: Cigarettes  . Smokeless tobacco: Never Used  . Alcohol use No    Review of Systems Constitutional: Negative for fever. Cardiovascular: Negative for chest pain. Respiratory: Negative for shortness of breath. Gastrointestinal: Negative for abdominal pain, vomiting and diarrhea. Genitourinary: Negative for dysuria. Musculoskeletal:Positive for right leg pain Skin: Negative for rash. Neurological: Negative for headaches, focal weakness or numbness.  10-point ROS otherwise negative.  ____________________________________________   PHYSICAL EXAM:  VITAL SIGNS: ED Triage Vitals  Enc Vitals Group     BP 03/09/16 1623 (!) 143/86     Pulse Rate 03/09/16 1623 79     Resp 03/09/16 1623 17     Temp 03/09/16 1623 97.6 F (36.4 C)     Temp Source 03/09/16 1623 Oral     SpO2 03/09/16 1623 100 %     Weight 03/09/16 1623 (!) 325 lb (147.4 kg)     Height 03/09/16 1623 5\' 5"   (1.651 m)     Head Circumference --      Peak Flow --      Pain Score 03/09/16 1626 8     Pain Loc --      Pain Edu? --      Excl. in GC? --     Constitutional: Alert and oriented. Well appearing and in no distress. Eyes: Conjunctivae are normal. PERRL. Normal extraocular movements. ENT   Head: Normocephalic and atraumatic.   Nose: No congestion/rhinnorhea.   Mouth/Throat: Mucous membranes are moist.   Neck: No stridor. Cardiovascular: Normal rate, regular rhythm. No murmurs, rubs, or gallops. Respiratory: Normal respiratory effort without tachypnea nor retractions. Breath sounds are clear and equal bilaterally. No wheezes/rales/rhonchi. Gastrointestinal: Soft and nontender. Normal bowel sounds Musculoskeletal: There is some tenderness around the anterior superior iliac spine. No inguinal adenopathy is appreciated Neurologic:  Normal speech and language. No gross focal neurologic deficits are appreciated.  Skin:  Skin is warm, dry and intact. No rash noted. Psychiatric: Mood and affect are normal. Speech and behavior are normal.  ____________________________________________  ED COURSE:  Pertinent labs & imaging results that were available during my care of the patient were reviewed by me and considered in my medical decision making (see chart for details). Clinical Course  Patient is in no distress, we will assess with basic labs. This seems clearly muscle or tendon related. I will provide oral pain medicine.  Procedures ____________________________________________   LABS (pertinent positives/negatives)  Labs  Reviewed  FIBRIN DERIVATIVES D-DIMER ([facility] ONLY) - Abnormal; Notable for the following:       Result Value   Fibrin derivatives D-dimer ([facility]) 592 (*)    All other components within normal limits  CBC - Abnormal; Notable for the following:    Hemoglobin 11.7 (*)    MCV 79.6 (*)    RDW 15.0 (*)    All other components within normal limits  BASIC METABOLIC  PANEL - Abnormal; Notable for the following:    Glucose, Bld 126 (*)    Creatinine, Ser 1.16 (*)    GFR calc non Af Amer 55 (*)    All other components within normal limits  Lower extremity ultrasound IMPRESSION: Sonographic survey of the right lower extremity negative for DVT.  ____________________________________________  FINAL ASSESSMENT AND PLAN  Musculoskeletal pain  Plan: Patient with labs as dictated above. Pain seems consistent with muscular or tendon pain associated with with abductor muscles. D-dimer was elevated but ultrasound was negative for DVT. I will prescribe anti-inflammatory medication, muscle relaxants and refer her to orthopedics for follow-up.   Emily FilbertWilliams, Jihan Mellette E, MD   Note: This dictation was prepared with Dragon dictation. Any transcriptional errors that result from this process are unintentional    Emily FilbertJonathan E Tene Gato, MD 03/09/16 1840

## 2016-03-09 NOTE — ED Triage Notes (Signed)
Rt groin pain on and off x 3 weeks. Thought her blood pressure might be high so signed in with that as well

## 2016-03-09 NOTE — ED Notes (Signed)

## 2016-05-02 ENCOUNTER — Emergency Department
Admission: EM | Admit: 2016-05-02 | Discharge: 2016-05-02 | Disposition: A | Discharge status: Home / Self Care | Payer: Self-pay | Attending: Emergency Medicine | Admitting: Emergency Medicine

## 2016-05-02 DIAGNOSIS — H109 Unspecified conjunctivitis: Secondary | ICD-10-CM | POA: Insufficient documentation

## 2016-05-02 DIAGNOSIS — Z79899 Other long term (current) drug therapy: Secondary | ICD-10-CM | POA: Insufficient documentation

## 2016-05-02 DIAGNOSIS — I10 Essential (primary) hypertension: Secondary | ICD-10-CM | POA: Insufficient documentation

## 2016-05-02 DIAGNOSIS — F17210 Nicotine dependence, cigarettes, uncomplicated: Secondary | ICD-10-CM | POA: Insufficient documentation

## 2016-05-02 DIAGNOSIS — Z791 Long term (current) use of non-steroidal anti-inflammatories (NSAID): Secondary | ICD-10-CM | POA: Insufficient documentation

## 2016-05-02 MED ORDER — CIPROFLOXACIN HCL 0.3 % OP SOLN: 1.0000 [drp] | OPHTHALMIC | 0 refills | Status: AC

## 2016-05-02 MED ORDER — CIPROFLOXACIN HCL 0.3 % OP SOLN
2.0000 [drp] | Freq: Once | OPHTHALMIC | Status: AC
  Administered 2016-05-02: 2 [drp] via OPHTHALMIC
  Filled 2016-05-02: qty 2.5

## 2016-05-02 NOTE — ED Triage Notes (Signed)
Pt in with co right eye redness with discharge noted x 3 days.

## 2016-05-02 NOTE — ED Provider Notes (Signed)
[facility] Emergency Department Provider Note  ____________________________________________  Time seen: 2:52 AM  I have reviewed the triage vital signs and the nursing notes.   HISTORY  Chief Complaint Eye Drainage     HPI Debra Owen is a 50 y.o. female presents with three-day history of right eye redness and drainage. Patient states that her eyelashes are stuck together on awakening in the morning. Patient denies any known contact with bacterial conjunctivitis. Patient denies any fever afebrile on presentation. Patient denies any visual changes     Past Medical History:  Diagnosis Date  . Hypertension     There are no active problems to display for this patient.   Past Surgical History:  Procedure Laterality Date  . CESAREAN SECTION      Current Outpatient Rx  . Order #: [id number] Class: Print  . Order #: [id number] Class: Print  . Order #: [id number] Class: Print  . Order #: [id number] Class: Print  . Order #: [id number] Class: Print  . Order #: [id number] Class: Print  . Order #: [id number] Class: Print  . Order #: [id number] Class: Print  . Order #: [id number] Class: Print    Allergies Morphine and related and Sulfa antibiotics  No family history on file.  Social History Social History  Substance Use Topics  . Smoking status: Current Every Day Smoker    Packs/day: 0.25    Types: Cigarettes  . Smokeless tobacco: Never Used  . Alcohol use No    Review of Systems  Constitutional: Negative for fever. Eyes: Negative for visual changes.Positive for right eye redness and drainage ENT: Negative for sore throat. Cardiovascular: Negative for chest pain. Respiratory: Negative for shortness of breath. Gastrointestinal: Negative for abdominal pain, vomiting and diarrhea. Genitourinary: Negative for dysuria. Musculoskeletal: Negative for back pain. Skin: Negative for rash. Neurological: Negative for headaches, focal weakness or  numbness.   10-point ROS otherwise negative.  ____________________________________________   PHYSICAL EXAM:  VITAL SIGNS: ED Triage Vitals  Enc Vitals Group     BP 05/02/16 0032 129/66     Pulse Rate 05/02/16 0031 91     Resp 05/02/16 0031 18     Temp 05/02/16 0031 97.7 F (36.5 C)     Temp Source 05/02/16 0031 Oral     SpO2 05/02/16 0031 100 %     Weight 05/02/16 0031 300 lb (136.1 kg)     Height 05/02/16 0031 5\' 5"  (1.651 m)     Head Circumference --      Peak Flow --      Pain Score 05/02/16 0032 6     Pain Loc --      Pain Edu? --      Excl. in GC? --     Constitutional: Alert and oriented. Well appearing and in no distress. Eyes: Right conjunctival erythema without exudate noted PERRL. Normal extraocular movements. ENT   Head: Normocephalic and atraumatic.   Nose: No congestion/rhinnorhea.   Mouth/Throat: Mucous membranes are moist.   Neck: No stridor. Hematological/Lymphatic/Immunilogical: No cervical lymphadenopathy.. Neurologic:  Normal speech and language. No gross focal neurologic deficits are appreciated. Speech is normal.  Skin:  Skin is warm, dry and intact. No rash noted. Psychiatric: Mood and affect are normal. Speech and behavior are normal. Patient exhibits appropriate insight and judgment.      Procedures     INITIAL IMPRESSION / ASSESSMENT AND PLAN / ED COURSE  Pertinent labs & imaging results that were available during my care of the patient were reviewed by me and considered  in my medical decision making (see chart for details).  History physical exam consistent with bacterial conjunctivitis as such Cipro ophthalmic was given patient be prescribed the same for home  ____________________________________________   FINAL CLINICAL IMPRESSION(S) / ED DIAGNOSES  Final diagnoses:  Bacterial conjunctivitis of right eye      Debra Currentandolph N Loyalty Arentz, MD 05/02/16 [phone]

## 2016-06-16 ENCOUNTER — Emergency Department
Admission: EM | Admit: 2016-06-16 | Discharge: 2016-06-17 | Disposition: A | Discharge status: Home / Self Care | Payer: Self-pay | Attending: Emergency Medicine | Admitting: Emergency Medicine

## 2016-06-16 ENCOUNTER — Encounter: Payer: Self-pay | Admitting: Emergency Medicine

## 2016-06-16 DIAGNOSIS — X503XXA Overexertion from repetitive movements, initial encounter: Secondary | ICD-10-CM | POA: Insufficient documentation

## 2016-06-16 DIAGNOSIS — I10 Essential (primary) hypertension: Secondary | ICD-10-CM | POA: Insufficient documentation

## 2016-06-16 DIAGNOSIS — Y990 Civilian activity done for income or pay: Secondary | ICD-10-CM | POA: Insufficient documentation

## 2016-06-16 DIAGNOSIS — Y9389 Activity, other specified: Secondary | ICD-10-CM | POA: Insufficient documentation

## 2016-06-16 DIAGNOSIS — Z79899 Other long term (current) drug therapy: Secondary | ICD-10-CM | POA: Insufficient documentation

## 2016-06-16 DIAGNOSIS — F17210 Nicotine dependence, cigarettes, uncomplicated: Secondary | ICD-10-CM | POA: Insufficient documentation

## 2016-06-16 DIAGNOSIS — M5441 Lumbago with sciatica, right side: Secondary | ICD-10-CM | POA: Insufficient documentation

## 2016-06-16 DIAGNOSIS — Y929 Unspecified place or not applicable: Secondary | ICD-10-CM | POA: Insufficient documentation

## 2016-06-16 MED ORDER — PREDNISONE 10 MG (21) PO TBPK: 10.0000 mg | ORAL_TABLET | Freq: Every day | ORAL | 0 refills | Status: DC

## 2016-06-16 NOTE — ED Notes (Signed)
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  She is c/o RIGHT lower cervical to mid lumbar back discomfort.  Her pain is ok when she is standing but when sitting down is hard for her to tolerate.

## 2016-06-16 NOTE — ED Triage Notes (Signed)
Pt presents to ED with mid-right sided back pain. Pt reports onset of pain was Thursday at work while she assisted a pt with transferring from a wheelchair. Pt reports pain has increased. No hx of the same. Tylenol and heating pad providing no relief.

## 2016-06-17 NOTE — ED Provider Notes (Signed)
[facility] Emergency Department Provider Note  ____________________________________________  Time seen: Approximately 11:35 AM  I have reviewed the triage vital signs and the nursing notes.   HISTORY  Chief Complaint Back Pain    HPI Debra Owen is a 51 y.o. female with a history of hypertension presents with aching 3/10 low back pain for the past 5 days. Patient states that she was moving a patient at her place of work when she felt "a pulling sensation". Low back pain has occurred since the incident. Low back pain radiates into her right buttocks. Patient states that she does not have a history of chronic back pain. Patient states that she has been lying in bed since the incident. She denies bowel or bladder incontinence. She denies weakness. Patient has been taking Flexeril, tylenol and applying heat.Patient denies prior surgeries to the low back. Has been afebrile. Patient denies dysuria and increased urinary frequency.  Past Medical History:  Diagnosis Date  . Hypertension     There are no active problems to display for this patient.   Past Surgical History:  Procedure Laterality Date  . CESAREAN SECTION      Prior to Admission medications   Medication Sig Start Date End Date Taking? Authorizing Provider  diazepam (VALIUM) 5 MG tablet Take 1 tablet (5 mg total) by mouth every 8 (eight) hours as needed for muscle spasms. 03/09/16   Emily Filbert, MD  dicyclomine (BENTYL) 20 MG tablet Take 1 tablet (20 mg total) by mouth 3 (three) times daily as needed for spasms. 01/08/15 01/08/16  Myrna Blazer, MD  hydrochlorothiazide (HYDRODIURIL) 25 MG tablet Take 1 tablet (25 mg total) by mouth daily. 11/14/15   Darci Current, MD  ibuprofen (ADVIL,MOTRIN) 800 MG tablet Take 1 tablet (800 mg total) by mouth every 8 (eight) hours as needed. 01/10/15   Emily Filbert, MD  meclizine (ANTIVERT) 25 MG tablet Take 2 tablets (50 mg total) by mouth 3  (three) times daily as needed for dizziness. 11/14/15   Darci Current, MD  ondansetron (ZOFRAN) 4 MG tablet Take 1 tablet (4 mg total) by mouth daily as needed for nausea or vomiting. 01/08/15   Myrna Blazer, MD  penicillin v potassium (VEETID) 250 MG tablet Take 1 tablet (250 mg total) by mouth 4 (four) times daily. 01/10/15   Emily Filbert, MD  predniSONE (STERAPRED UNI-PAK 21 TAB) 10 MG (21) TBPK tablet Take 1 tablet (10 mg total) by mouth daily. Take 6 tablets on the first day, take 5 tablets on the second day, take 4 tablets on the third day, take 3 tablets on the fourth day, take 2 tablets on the 5th day, take 1 tablet on the 6th day. 06/16/16   Orvil Feil, PA-C  traMADol (ULTRAM) 50 MG tablet Take 1 tablet (50 mg total) by mouth every 6 (six) hours as needed. 03/09/16 03/09/17  Emily Filbert, MD    Allergies Morphine and related and Sulfa antibiotics  No family history on file.  Social History Social History  Substance Use Topics  . Smoking status: Current Every Day Smoker    Packs/day: 0.25    Types: Cigarettes  . Smokeless tobacco: Never Used  . Alcohol use No     Review of Systems  Constitutional: No fever/chills Cardiovascular: no chest pain. Respiratory: no cough. No SOB. Gastrointestinal: No abdominal pain.  No nausea, no vomiting.  No diarrhea.  No constipation. Genitourinary: Negative for dysuria. No  hematuria Musculoskeletal: Patient has low back pain.  Skin: Negative for rash, abrasions, lacerations, ecchymosis. Neurological: Negative for headaches, focal weakness or numbness. 10-point ROS otherwise negative.  ____________________________________________   PHYSICAL EXAM:  VITAL SIGNS: ED Triage Vitals  Enc Vitals Group     BP 06/16/16 1933 (!) 142/89     Pulse Rate 06/16/16 1933 73     Resp 06/16/16 1933 18     Temp 06/16/16 1933 98.7 F (37.1 C)     Temp Source 06/16/16 1933 Oral     SpO2 06/16/16 1933 100 %     Weight 06/16/16  1934 300 lb (136.1 kg)     Height 06/16/16 1934 5\' 5"  (1.651 m)     Head Circumference --      Peak Flow --      Pain Score 06/16/16 1934 7     Pain Loc --      Pain Edu? --      Excl. in GC? --      Constitutional: Alert and oriented. Well appearing and in no acute distress. Cardiovascular: Normal rate, regular rhythm. Normal S1 and S2.  Good peripheral circulation. Respiratory: Normal respiratory effort without tachypnea or retractions. Lungs CTAB. Good air entry to the bases with no decreased or absent breath sounds. Gastrointestinal: Bowel sounds 4 quadrants. Soft and nontender to palpation. No guarding or rigidity. No palpable masses. No distention. No CVA tenderness. Musculoskeletal: Patient has 5/5 strength in the upper and lower extremities bilaterally. Full range of motion at the shoulder, elbow and wrist bilaterally. Full range of motion at the hip, knee and ankle bilaterally. No changes in gait. Patient has intensification of low back pain with flexion at the spine. Pain radiates into patient's right buttocks. No pain is elicited with extension at the spine. Patient has no tenderness with palpation of the midline lumbar spine.  Neurologic:  Normal speech and language. No gross focal neurologic deficits are appreciated. Cranial nerves: 2-10 normal as tested. Cerebellar: Finger-nose-finger WNL, heel to shin WNL.  Skin:  Skin is warm, dry and intact. No rash noted. Psychiatric: Mood and affect are normal. Speech and behavior are normal. Patient exhibits appropriate insight and judgement. ____________________________________________   LABS (all labs ordered are listed, but only abnormal results are displayed)  Labs Reviewed - No data to display ____________________________________________  EKG   ____________________________________________  RADIOLOGY   No results found.  ____________________________________________    PROCEDURES  Procedure(s) performed:     Procedures    Medications - No data to display   ____________________________________________   INITIAL IMPRESSION / ASSESSMENT AND PLAN / ED COURSE  Pertinent labs & imaging results that were available during my care of the patient were reviewed by me and considered in my medical decision making (see chart for details).  Review of the [place] CSRS was performed in accordance of the [facility] prior to dispensing any controlled drugs.  Clinical Course    Assessment and Plan Lumbar Strain:  Patient has had a 5 day course of low back pain with radiation of pain into right buttocks. Patient states that she has been lying in bed trying to relieve pain. She denies bowel or bladder incontinence. She denies dysuria or increased urinary frequency. Lumbar strain is likely. Patient was encouraged to ambulate. Patient was advised to avoid bed rest. As patient has low back pain with radiculopathy into the right buttocks, she was discharged with prednisone. A referral was made to orthopedics, Dr. Cloyde Reams. Patient was advised to make  an appointment in one week if low back pain persists. All patient questions were answered. ____________________________________________  FINAL CLINICAL IMPRESSION(S) / ED DIAGNOSES  Final diagnoses:  Acute right-sided low back pain with right-sided sciatica      NEW MEDICATIONS STARTED DURING THIS VISIT:  Discharge Medication List as of 06/16/2016 11:34 PM    START taking these medications   Details  predniSONE (STERAPRED UNI-PAK 21 TAB) 10 MG (21) TBPK tablet Take 1 tablet (10 mg total) by mouth daily. Take 6 tablets on the first day, take 5 tablets on the second day, take 4 tablets on the third day, take 3 tablets on the fourth day, take 2 tablets on the 5th day, take 1 tablet on the 6th day., Starting Mon 1 /06/2016, Print            This chart was dictated using voice recognition software/Dragon. Despite best efforts to proofread, errors can occur which can  change the meaning. Any change was purely unintentional.    Orvil FeilJaclyn M Tylicia Sherman, PA-C 06/17/16 1149    Jeanmarie PlantJames A McShane, MD 06/19/16 2234

## 2016-11-06 ENCOUNTER — Emergency Department
Admission: EM | Admit: 2016-11-06 | Discharge: 2016-11-07 | Disposition: A | Discharge status: Home / Self Care | Payer: Self-pay | Attending: Emergency Medicine | Admitting: Emergency Medicine

## 2016-11-06 DIAGNOSIS — I10 Essential (primary) hypertension: Secondary | ICD-10-CM | POA: Insufficient documentation

## 2016-11-06 DIAGNOSIS — F17210 Nicotine dependence, cigarettes, uncomplicated: Secondary | ICD-10-CM | POA: Insufficient documentation

## 2016-11-06 DIAGNOSIS — R42 Dizziness and giddiness: Secondary | ICD-10-CM | POA: Insufficient documentation

## 2016-11-06 DIAGNOSIS — Z791 Long term (current) use of non-steroidal anti-inflammatories (NSAID): Secondary | ICD-10-CM | POA: Insufficient documentation

## 2016-11-06 DIAGNOSIS — Z79899 Other long term (current) drug therapy: Secondary | ICD-10-CM | POA: Insufficient documentation

## 2016-11-06 LAB — COMPREHENSIVE METABOLIC PANEL
ALBUMIN: 3.8 g/dL (ref 3.5–5.0)
ALT: 56 U/L — ABNORMAL HIGH (ref 14–54)
ANION GAP: 3 — AB (ref 5–15)
AST: 57 U/L — ABNORMAL HIGH (ref 15–41)
Alkaline Phosphatase: 224 U/L — ABNORMAL HIGH (ref 38–126)
BUN: 20 mg/dL (ref 6–20)
CO2: 27 mmol/L (ref 22–32)
Calcium: 9.2 mg/dL (ref 8.9–10.3)
Chloride: 105 mmol/L (ref 101–111)
Creatinine, Ser: 1.32 mg/dL — ABNORMAL HIGH (ref 0.44–1.00)
GFR calc non Af Amer: 47 mL/min — ABNORMAL LOW (ref >60)
GFR, EST AFRICAN AMERICAN: 54 mL/min — AB (ref >60)
GLUCOSE: 90 mg/dL (ref 65–99)
POTASSIUM: 3.6 mmol/L (ref 3.5–5.1)
SODIUM: 135 mmol/L (ref 135–145)
TOTAL PROTEIN: 7.7 g/dL (ref 6.5–8.1)
Total Bilirubin: 0.3 mg/dL (ref 0.3–1.2)

## 2016-11-06 LAB — URINALYSIS, COMPLETE (UACMP) WITH MICROSCOPIC
BACTERIA UA: NONE SEEN
Bilirubin Urine: NEGATIVE
GLUCOSE, UA: NEGATIVE mg/dL
Hgb urine dipstick: NEGATIVE
Ketones, ur: NEGATIVE mg/dL
LEUKOCYTES UA: NEGATIVE
NITRITE: NEGATIVE
PH: 5 (ref 5.0–8.0)
PROTEIN: NEGATIVE mg/dL
RBC / HPF: NONE SEEN RBC/hpf (ref 0–5)
Specific Gravity, Urine: 1.02 (ref 1.005–1.030)

## 2016-11-06 LAB — CBC
HCT: 31.5 % — ABNORMAL LOW (ref 35.0–47.0)
Hemoglobin: 10.3 g/dL — ABNORMAL LOW (ref 12.0–16.0)
MCH: 25.2 pg — AB (ref 26.0–34.0)
MCHC: 32.7 g/dL (ref 32.0–36.0)
MCV: 76.9 fL — ABNORMAL LOW (ref 80.0–100.0)
Platelets: 219 10*3/uL (ref 150–440)
RBC: 4.09 MIL/uL (ref 3.80–5.20)
RDW: 15.3 % — AB (ref 11.5–14.5)
WBC: 4.3 10*3/uL (ref 3.6–11.0)

## 2016-11-06 LAB — TROPONIN I: Troponin I: <0.03 ng/mL (ref <0.03)

## 2016-11-06 LAB — POCT PREGNANCY, URINE: Preg Test, Ur: NEGATIVE

## 2016-11-06 NOTE — ED Triage Notes (Signed)
Pt in with co dizziness and feeling "off balance since today. Also co bilat leg edema, states hx of the same and it was due to htn. Pt has been off bp meds for a few months, also co headache.

## 2016-11-07 MED ORDER — HYDROCHLOROTHIAZIDE 25 MG PO TABS: 25.0000 mg | ORAL_TABLET | Freq: Every day | ORAL | Status: DC

## 2016-11-07 MED ORDER — HYDROCHLOROTHIAZIDE 25 MG PO TABS: 25.0000 mg | ORAL_TABLET | Freq: Every day | ORAL | 2 refills | Status: DC

## 2016-11-07 MED ORDER — HYDROCHLOROTHIAZIDE 25 MG PO TABS
25.0000 mg | ORAL_TABLET | Freq: Once | ORAL | Status: AC
  Administered 2016-11-07: 25 mg via ORAL

## 2016-11-07 MED ORDER — HYDROCHLOROTHIAZIDE 25 MG PO TABS
ORAL_TABLET | ORAL | Status: AC
  Administered 2016-11-07: 25 mg via ORAL
  Filled 2016-11-07: qty 1

## 2016-11-07 NOTE — ED Notes (Signed)

## 2016-11-07 NOTE — ED Provider Notes (Signed)
[facility] Emergency Department Provider Note  Time seen: 12:54 AM  I have reviewed the triage vital signs and the nursing notes.   HISTORY  Chief Complaint Dizziness and Headache    HPI Debra Owen is a 51 y.o. female with a past medical history of hypertension, off of her medications for the past 2 or 3 months presents to the emergency department with headache, dizziness. According to the patient for the past 34 days she has been having intermittent headaches as well as a dizziness sensation. Denies any focal weakness or numbness, confusion or slurred speech. Denies any chest pain or trouble breathing. Otherwise largely negative review of systems. Patient states he has had similar symptoms in the past when her blood pressure is elevated. She checked it tonight and was 150 systolic so she came to the emergency department for evaluation.  Past Medical History:  Diagnosis Date  . Hypertension     There are no active problems to display for this patient.   Past Surgical History:  Procedure Laterality Date  . CESAREAN SECTION      Prior to Admission medications   Medication Sig Start Date End Date Taking? Authorizing Provider  diazepam (VALIUM) 5 MG tablet Take 1 tablet (5 mg total) by mouth every 8 (eight) hours as needed for muscle spasms. 03/09/16   Emily Filbert, MD  dicyclomine (BENTYL) 20 MG tablet Take 1 tablet (20 mg total) by mouth 3 (three) times daily as needed for spasms. 01/08/15 01/08/16  Myrna Blazer, MD  hydrochlorothiazide (HYDRODIURIL) 25 MG tablet Take 1 tablet (25 mg total) by mouth daily. 11/14/15   Darci Current, MD  ibuprofen (ADVIL,MOTRIN) 800 MG tablet Take 1 tablet (800 mg total) by mouth every 8 (eight) hours as needed. 01/10/15   Emily Filbert, MD  meclizine (ANTIVERT) 25 MG tablet Take 2 tablets (50 mg total) by mouth 3 (three) times daily as needed for dizziness. 11/14/15   Darci Current, MD   ondansetron (ZOFRAN) 4 MG tablet Take 1 tablet (4 mg total) by mouth daily as needed for nausea or vomiting. 01/08/15   Myrna Blazer, MD  penicillin v potassium (VEETID) 250 MG tablet Take 1 tablet (250 mg total) by mouth 4 (four) times daily. 01/10/15   Emily Filbert, MD  predniSONE (STERAPRED UNI-PAK 21 TAB) 10 MG (21) TBPK tablet Take 1 tablet (10 mg total) by mouth daily. Take 6 tablets on the first day, take 5 tablets on the second day, take 4 tablets on the third day, take 3 tablets on the fourth day, take 2 tablets on the 5th day, take 1 tablet on the 6th day. 06/16/16   Orvil Feil, PA-C  traMADol (ULTRAM) 50 MG tablet Take 1 tablet (50 mg total) by mouth every 6 (six) hours as needed. 03/09/16 03/09/17  Emily Filbert, MD    Allergies  Allergen Reactions  . Morphine And Related Other (See Comments)    Bradycardia   . Sulfa Antibiotics Rash    No family history on file.  Social History Social History  Substance Use Topics  . Smoking status: Current Every Day Smoker    Packs/day: 0.25    Types: Cigarettes  . Smokeless tobacco: Never Used  . Alcohol use No    Review of Systems Constitutional: Negative for fever.Dizziness at times Eyes: States some blurred vision at times. ENT: Negative for congestion Cardiovascular: Negative for chest pain. Respiratory: Negative for shortness of breath.  Gastrointestinal: Negative for abdominal pain Genitourinary: Negative for dysuria. Musculoskeletal: Mild swelling of her ankles. Skin: Negative for rash. Neurological: Negative for headache All other ROS negative  ____________________________________________   PHYSICAL EXAM:  VITAL SIGNS: ED Triage Vitals  Enc Vitals Group     BP 11/06/16 2018 (!) 171/97     Pulse Rate 11/06/16 2018 87     Resp 11/06/16 2018 20     Temp 11/06/16 2018 98.4 F (36.9 C)     Temp Source 11/06/16 2018 Oral     SpO2 11/06/16 2018 99 %     Weight 11/06/16 2020 300 lb (136.1  kg)     Height 11/06/16 2020 5\' 5"  (1.651 m)     Head Circumference --      Peak Flow --      Pain Score 11/06/16 2028 4     Pain Loc --      Pain Edu? --      Excl. in GC? --     Constitutional: Alert and oriented. Well appearing and in no distress. Eyes: Normal exam ENT   Head: Normocephalic and atraumatic   Mouth/Throat: Mucous membranes are moist. Cardiovascular: Normal rate, regular rhythm. No murmur Respiratory: Normal respiratory effort without tachypnea nor retractions. Breath sounds are clear Gastrointestinal: Soft and nontender. No distention.  Musculoskeletal: Nontender with normal range of motion in all extremities. Mild pedal edema equal bilaterally. 2+ DP pulses. Neurologic:  Normal speech and language. No gross focal neurologic deficits  Skin:  Skin is warm, dry and intact.  Psychiatric: Mood and affect are normal.   ____________________________________________    EKG  EKG reviewed and interpreted by myself shows normal sinus rhythm at 71 bpm, narrow QRS, normal axis, normal intervals, no ST changes. Normal EKG.  ____________________________________________   INITIAL IMPRESSION / ASSESSMENT AND PLAN / ED COURSE  Pertinent labs & imaging results that were available during my care of the patient were reviewed by me and considered in my medical decision making (see chart for details).  Patient presents for high blood pressure, dizziness and pedal edema. Patient states she has been off of her hydrochlorothiazide for approximately 3 months. Has a primary care doctor but has not followed up. Otherwise patient denies most all other symptoms on review of systems. She appears well. Patient's labs show a mild renal insufficiency, negative troponin. We will restart the patient on hydrochlorothiazide and have her follow up with a primary care doctor. I discussed return precautions for any chest pain, trouble breathing. Patient  agreeable.  ____________________________________________   FINAL CLINICAL IMPRESSION(S) / ED DIAGNOSES  Dizziness Hypertension    Debra Owen, Debra Guilfoil, MD 11/07/16 [id number]

## 2017-03-13 ENCOUNTER — Emergency Department: Payer: No Typology Code available for payment source

## 2017-03-13 ENCOUNTER — Emergency Department
Admission: EM | Admit: 2017-03-13 | Discharge: 2017-03-13 | Disposition: A | Discharge status: Home / Self Care | Payer: No Typology Code available for payment source | Attending: Emergency Medicine | Admitting: Emergency Medicine

## 2017-03-13 ENCOUNTER — Encounter: Payer: Self-pay | Admitting: Emergency Medicine

## 2017-03-13 DIAGNOSIS — Y929 Unspecified place or not applicable: Secondary | ICD-10-CM | POA: Insufficient documentation

## 2017-03-13 DIAGNOSIS — Z79899 Other long term (current) drug therapy: Secondary | ICD-10-CM | POA: Insufficient documentation

## 2017-03-13 DIAGNOSIS — I10 Essential (primary) hypertension: Secondary | ICD-10-CM | POA: Insufficient documentation

## 2017-03-13 DIAGNOSIS — M549 Dorsalgia, unspecified: Secondary | ICD-10-CM | POA: Insufficient documentation

## 2017-03-13 DIAGNOSIS — Y939 Activity, unspecified: Secondary | ICD-10-CM | POA: Insufficient documentation

## 2017-03-13 DIAGNOSIS — Y999 Unspecified external cause status: Secondary | ICD-10-CM | POA: Insufficient documentation

## 2017-03-13 DIAGNOSIS — M542 Cervicalgia: Secondary | ICD-10-CM | POA: Insufficient documentation

## 2017-03-13 MED ORDER — CYCLOBENZAPRINE HCL 10 MG PO TABS: 10.0000 mg | ORAL_TABLET | Freq: Three times a day (TID) | ORAL | 0 refills | Status: AC | PRN

## 2017-03-13 MED ORDER — CYCLOBENZAPRINE HCL 10 MG PO TABS
5.0000 mg | ORAL_TABLET | Freq: Once | ORAL | Status: AC
  Administered 2017-03-13: 5 mg via ORAL
  Filled 2017-03-13: qty 1

## 2017-03-13 MED ORDER — KETOROLAC TROMETHAMINE 30 MG/ML IJ SOLN
30.0000 mg | Freq: Once | INTRAMUSCULAR | Status: AC
Start: 2017-03-13 — End: 2017-03-13
  Administered 2017-03-13: 30 mg via INTRAMUSCULAR
  Filled 2017-03-13: qty 1

## 2017-03-13 MED ORDER — MELOXICAM 7.5 MG PO TABS
7.5000 mg | ORAL_TABLET | Freq: Every day | ORAL | 1 refills | Status: AC
Start: 2017-03-13 — End: 2017-03-20

## 2017-03-13 NOTE — ED Notes (Signed)
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
## 2017-04-03 ENCOUNTER — Emergency Department
Admission: EM | Admit: 2017-04-03 | Discharge: 2017-04-03 | Disposition: A | Discharge status: Home / Self Care | Payer: Self-pay | Attending: Emergency Medicine | Admitting: Emergency Medicine

## 2017-04-03 ENCOUNTER — Encounter: Payer: Self-pay | Admitting: Emergency Medicine

## 2017-04-03 ENCOUNTER — Emergency Department: Payer: Self-pay

## 2017-04-03 DIAGNOSIS — R42 Dizziness and giddiness: Secondary | ICD-10-CM | POA: Insufficient documentation

## 2017-04-03 DIAGNOSIS — Z79899 Other long term (current) drug therapy: Secondary | ICD-10-CM | POA: Insufficient documentation

## 2017-04-03 DIAGNOSIS — I10 Essential (primary) hypertension: Secondary | ICD-10-CM | POA: Insufficient documentation

## 2017-04-03 DIAGNOSIS — F17210 Nicotine dependence, cigarettes, uncomplicated: Secondary | ICD-10-CM | POA: Insufficient documentation

## 2017-04-03 LAB — URINALYSIS, COMPLETE (UACMP) WITH MICROSCOPIC
Bacteria, UA: NONE SEEN
Bilirubin Urine: NEGATIVE
Glucose, UA: NEGATIVE mg/dL
Hgb urine dipstick: NEGATIVE
KETONES UR: NEGATIVE mg/dL
LEUKOCYTES UA: NEGATIVE
Nitrite: NEGATIVE
PH: 5 (ref 5.0–8.0)
Protein, ur: 30 mg/dL — AB
Specific Gravity, Urine: 1.031 — ABNORMAL HIGH (ref 1.005–1.030)

## 2017-04-03 LAB — CBC
HEMATOCRIT: 33.7 % — AB (ref 35.0–47.0)
Hemoglobin: 10.8 g/dL — ABNORMAL LOW (ref 12.0–16.0)
MCH: 25.4 pg — AB (ref 26.0–34.0)
MCHC: 32.1 g/dL (ref 32.0–36.0)
MCV: 79.3 fL — AB (ref 80.0–100.0)
PLATELETS: 206 10*3/uL (ref 150–440)
RBC: 4.25 MIL/uL (ref 3.80–5.20)
RDW: 15.1 % — AB (ref 11.5–14.5)
WBC: 4.4 10*3/uL (ref 3.6–11.0)

## 2017-04-03 LAB — POCT PREGNANCY, URINE: Preg Test, Ur: NEGATIVE

## 2017-04-03 LAB — BASIC METABOLIC PANEL
Anion gap: 9 (ref 5–15)
BUN: 17 mg/dL (ref 6–20)
CHLORIDE: 106 mmol/L (ref 101–111)
CO2: 23 mmol/L (ref 22–32)
CREATININE: 1.07 mg/dL — AB (ref 0.44–1.00)
Calcium: 8.6 mg/dL — ABNORMAL LOW (ref 8.9–10.3)
GFR calc Af Amer: >60 mL/min (ref >60)
GFR calc non Af Amer: 60 mL/min — ABNORMAL LOW (ref >60)
GLUCOSE: 105 mg/dL — AB (ref 65–99)
POTASSIUM: 3.9 mmol/L (ref 3.5–5.1)
SODIUM: 138 mmol/L (ref 135–145)

## 2017-04-03 LAB — TROPONIN I

## 2017-04-03 MED ORDER — HYDROCHLOROTHIAZIDE 25 MG PO TABS: 25.0000 mg | ORAL_TABLET | Freq: Every day | ORAL | 0 refills | Status: DC

## 2017-04-03 MED ORDER — MECLIZINE HCL 25 MG PO TABS
25.0000 mg | ORAL_TABLET | Freq: Once | ORAL | Status: AC
  Administered 2017-04-03: 25 mg via ORAL
  Filled 2017-04-03: qty 1

## 2017-04-03 MED ORDER — SODIUM CHLORIDE 0.9 % IV BOLUS (SEPSIS)
1000.0000 mL | Freq: Once | INTRAVENOUS | Status: AC
  Administered 2017-04-03: 1000 mL via INTRAVENOUS

## 2017-04-03 MED ORDER — MECLIZINE HCL 25 MG PO TABS: 25.0000 mg | ORAL_TABLET | Freq: Three times a day (TID) | ORAL | 0 refills | Status: DC | PRN

## 2017-04-03 NOTE — ED Notes (Signed)
Patient is resting comfortably. 

## 2017-04-03 NOTE — ED Notes (Signed)
ED Provider at bedside. 

## 2017-04-03 NOTE — ED Triage Notes (Signed)
Pt reports felt weakness yesterday and like she is coming down with something. Pt reports awoke with dizziness today. Pt reports has had slight headache since yesterday. No focal deficits noted in triage. Speech clear. Pt alert and oriented. Ambulatory to triage. Pt reports has been out of blood pressure medication for three weeks because her prescription ran out.

## 2017-04-03 NOTE — ED Provider Notes (Addendum)
[facility]  ____________________________________________   I have reviewed the triage vital signs and the nursing notes.   HISTORY  Chief Complaint Dizziness    HPI Debra Owen is a 51 y.o. female Who presents today complaining of feeling lightheaded. She states "I feel like him coming down with something". Patient is noncompliant with her medications. she does have a history of hypertension otherwise does not have significant medical problems. She has had episodes like this before when she's been off her medications were she "doesn't feel right". She was seen here for very similar not too long ago with a reassuring workup and was restarted on her meds and felt much better. Patient states that she's been feeling somewhat lightheaded since last night. She denies any focal neurologic deficit she denies any significant headache although she has a "slight headache and "maybe 1 out of 10. She denies any fever chill stiff neck, change in vision or hearing focal numbness or weakness difficulty with ambulation she denies any diarrhea, she denies any chest pain, she has no shortness of breath, she has no abdominal pain she states she didn't eat today She "doesn't feel very good". However, she is not diabetic. She has had no leg swelling, no recent travel, review of systems is negative in all respect aside from the fact the patient "doesn't feel quite right, like maybe I amcoming down with something"  Past Medical History:  Diagnosis Date  . Hypertension     There are no active problems to display for this patient.   Past Surgical History:  Procedure Laterality Date  . CESAREAN SECTION      Prior to Admission medications   Medication Sig Start Date End Date Taking? Authorizing Provider  diazepam (VALIUM) 5 MG tablet Take 1 tablet (5 mg total) by mouth every 8 (eight) hours as needed for muscle spasms. 03/09/16   Emily Filbert, MD  dicyclomine (BENTYL) 20 MG tablet Take 1 tablet (20 mg total) by mouth 3 (three) times daily as needed for spasms. 01/08/15 01/08/16  Myrna Blazer, MD  hydrochlorothiazide (HYDRODIURIL) 25 MG tablet Take 1 tablet (25 mg total) by mouth daily. 11/07/16   Minna Antis, MD  ibuprofen (ADVIL,MOTRIN) 800 MG tablet Take 1 tablet (800 mg total) by mouth every 8 (eight) hours as needed. 01/10/15   Emily Filbert, MD  meclizine (ANTIVERT) 25 MG tablet Take 2 tablets (50 mg total) by mouth 3 (three) times daily as needed for dizziness. 11/14/15   Darci Current, MD  ondansetron (ZOFRAN) 4 MG tablet Take 1 tablet (4 mg total) by mouth daily as needed for nausea or vomiting. 01/08/15   Myrna Blazer, MD  penicillin v potassium (VEETID) 250 MG tablet Take 1 tablet (250 mg total) by mouth 4 (four) times daily. 01/10/15   Emily Filbert, MD  predniSONE (STERAPRED UNI-PAK 21 TAB) 10 MG (21) TBPK tablet Take 1 tablet (10 mg total) by mouth daily. Take 6 tablets on the first day, take 5 tablets on the second day, take 4 tablets on the third day, take 3 tablets on the fourth day, take 2 tablets on the 5th day, take 1 tablet on the 6th day. 06/16/16   Orvil Feil, PA-C    Allergies Morphine and related and Sulfa antibiotics  No family history on file.  Social History Social History  Substance Use Topics  . Smoking status: Current Every Day Smoker  Packs/day: 0.25    Types: Cigarettes  . Smokeless tobacco: Never Used  . Alcohol use No    Review of Systems Constitutional: No fever/chills Eyes: No visual changes. ENT: No sore throat. No stiff neck no neck pain Cardiovascular: Denies chest pain. Respiratory: Denies shortness of breath. Gastrointestinal:   no vomiting.  No diarrhea.  No constipation. Genitourinary: Negative for dysuria. Musculoskeletal: Negative lower extremity swelling Skin: Negative for rash. Neurological: Negative for severe  headaches, focal weakness or numbness.   ____________________________________________   PHYSICAL EXAM:  VITAL SIGNS: ED Triage Vitals  Enc Vitals Group     BP 04/03/17 1145 (!) 142/84     Pulse Rate 04/03/17 1145 64     Resp 04/03/17 1145 18     Temp 04/03/17 1145 98 F (36.7 C)     Temp Source 04/03/17 1145 Oral     SpO2 04/03/17 1145 98 %     Weight 04/03/17 1146 300 lb (136.1 kg)     Height 04/03/17 1146 5\' 5"  (1.651 m)     Head Circumference --      Peak Flow --      Pain Score 04/03/17 1145 6     Pain Loc --      Pain Edu? --      Excl. in GC? --     Constitutional: Alert and oriented. Well appearing and in no acute distress. Eyes: Conjunctivae are normal Head: Atraumatic HEENT: No congestion/rhinnorhea. Mucous membranes are moist.  Oropharynx non-erythematous Neck:   Nontender with no meningismus, no masses, no stridor Cardiovascular: Normal rate, regular rhythm. Grossly normal heart sounds.  Good peripheral circulation. Respiratory: Normal respiratory effort.  No retractions. Lungs CTAB. Abdominal: Soft and nontender. No distention. No guarding no rebound Back:  There is no focal tenderness or step off.  there is no midline tenderness there are no lesions noted. there is no CVA tenderness  Musculoskeletal: No lower extremity tenderness, no upper extremity tenderness. No joint effusions, no DVT signs strong distal pulses no edema Neurologic:  Cranial nerves II through XII are grossly intact 5 out of 5 strength bilateral upper and lower extremity. Finger to nose within normal limits heel to shin within normal limits, speech is normal with no word finding difficulty or dysarthria, reflexes symmetric, pupils are equally round and reactive to light, there is no pronator drift, sensation is normal, vision is intact to confrontation, gait is deferred, there is no nystagmus, normal neurologic examdate was normal to the room Skin:  Skin is warm, dry and intact. No rash  noted. Psychiatric: Mood and affect are normal. Speech and behavior are normal.  ____________________________________________   LABS (all labs ordered are listed, but only abnormal results are displayed)  Labs Reviewed  BASIC METABOLIC PANEL - Abnormal; Notable for the following:       Result Value   Glucose, Bld 105 (*)    Creatinine, Ser 1.07 (*)    Calcium 8.6 (*)    GFR calc non Af Amer 60 (*)    All other components within normal limits  CBC - Abnormal; Notable for the following:    Hemoglobin 10.8 (*)    HCT 33.7 (*)    MCV 79.3 (*)    MCH 25.4 (*)    RDW 15.1 (*)    All other components within normal limits  URINALYSIS, COMPLETE (UACMP) WITH MICROSCOPIC  TROPONIN I  POCT PREGNANCY, URINE    Pertinent labs  results that were available during my care of  the patient were reviewed by me and considered in my medical decision making (see chart for details). ____________________________________________  EKG  I personally interpreted any EKGs ordered by me or triage normal sinus rhythm rate 71 bpm no acute ST elevation or depression normal axis unremarkable EKG ____________________________________________  RADIOLOGY  Pertinent labs & imaging results that were available during my care of the patient were reviewed by me and considered in my medical decision making (see chart for details). If possible, patient and/or family made aware of any abnormal findings. ____________________________________________    PROCEDURES  Procedure(s) performed: None  Procedures  Critical Care performed: None  ____________________________________________   INITIAL IMPRESSION / ASSESSMENT AND PLAN / ED COURSE  Pertinent labs & imaging results that were available during my care of the patient were reviewed by me and considered in my medical decision making (see chart for details).  patient with very vague symptoms of "I feel like I might be coming down with something". She doesn't  have any focal neurologic deficit, she has a history of hypertension and she feels "dizzy" but not vertiginous per se. Given her history of hypertension and this somewhat vague finding will obtain a CT scan as a precaution on the low suspicion for I asked her bleed etc. Patient has had similar feelings and she's been off her medications before. We will give her some IV fluids here, as she thinks dehydration might be controlling, blood work is reassuring we'll check a urinalysis as well astroponin although low suspicion for ACS given normal EKG and no chest pain or shortness of breath etc. Given symptoms since yesterday I think serial enzymes will be indicated. NIH stroke scale is 0,   overall patient quite well appearing and possibly this is due to skipping breakfast she states she has not had any food today we will see if she can eat and we will restart her medications.patient does state that she has a history of vertigo, and this feels similar, but she never saw a neurologist for her she'll negative CT of the head and 2017 for similar symptoms, she feels this does not feel exactly the same however there are some subtle differences that she cannot express. Nonetheless I will give her fluids and Antivert and see if she feels better      ----------------------------------------- 2:38 PM on 04/03/2017 -----------------------------------------  Pt with no complaints at this time. Took a nice nap and feels much better.  Neuro exam is completely normal, patient no acute distress ____________________________________________   FINAL CLINICAL IMPRESSION(S) / ED DIAGNOSES  Final diagnoses:  None      This chart was dictated using voice recognition software.  Despite best efforts to proofread,  errors can occur which can change meaning.      Jeanmarie Plant, MD 04/03/17 1302    Jeanmarie Plant, MD 04/03/17 1304    Jeanmarie Plant, MD 04/03/17 [phone]

## 2017-04-03 NOTE — ED Notes (Signed)
Pt alert and oriented X4, active, cooperative, pt in NAD. RR even and unlabored, color WNL.  Pt informed to return if any life threatening symptoms occur.  Discharge and followup instructions reviewed. Pt dizziness has ceased.

## 2017-04-03 NOTE — ED Notes (Signed)
Pt ambulates back to room with ease.  

## 2017-08-04 ENCOUNTER — Emergency Department
Admission: EM | Admit: 2017-08-04 | Discharge: 2017-08-04 | Disposition: A | Discharge status: Home / Self Care | Payer: Self-pay | Attending: Emergency Medicine | Admitting: Emergency Medicine

## 2017-08-04 ENCOUNTER — Encounter: Payer: Self-pay | Admitting: Emergency Medicine

## 2017-08-04 ENCOUNTER — Other Ambulatory Visit: Payer: Self-pay

## 2017-08-04 DIAGNOSIS — I10 Essential (primary) hypertension: Secondary | ICD-10-CM | POA: Insufficient documentation

## 2017-08-04 DIAGNOSIS — J029 Acute pharyngitis, unspecified: Secondary | ICD-10-CM | POA: Insufficient documentation

## 2017-08-04 DIAGNOSIS — Z79899 Other long term (current) drug therapy: Secondary | ICD-10-CM | POA: Insufficient documentation

## 2017-08-04 DIAGNOSIS — R0981 Nasal congestion: Secondary | ICD-10-CM | POA: Insufficient documentation

## 2017-08-04 DIAGNOSIS — F17210 Nicotine dependence, cigarettes, uncomplicated: Secondary | ICD-10-CM | POA: Insufficient documentation

## 2017-08-04 DIAGNOSIS — R6889 Other general symptoms and signs: Secondary | ICD-10-CM

## 2017-08-04 DIAGNOSIS — R51 Headache: Secondary | ICD-10-CM | POA: Insufficient documentation

## 2017-08-04 LAB — INFLUENZA PANEL BY PCR (TYPE A & B)
INFLBPCR: NEGATIVE
Influenza A By PCR: NEGATIVE

## 2017-08-04 MED ORDER — IBUPROFEN 800 MG PO TABS: 800.0000 mg | ORAL_TABLET | Freq: Three times a day (TID) | ORAL | 0 refills | Status: DC | PRN

## 2017-08-04 MED ORDER — KETOROLAC TROMETHAMINE 60 MG/2ML IM SOLN
60.0000 mg | Freq: Once | INTRAMUSCULAR | Status: AC
  Administered 2017-08-04: 60 mg via INTRAMUSCULAR
  Filled 2017-08-04: qty 2

## 2017-08-04 MED ORDER — ONDANSETRON HCL 8 MG PO TABS: 8.0000 mg | ORAL_TABLET | Freq: Three times a day (TID) | ORAL | 0 refills | Status: DC | PRN

## 2017-08-04 MED ORDER — ONDANSETRON 8 MG PO TBDP
8.0000 mg | ORAL_TABLET | Freq: Once | ORAL | Status: AC
  Administered 2017-08-04: 8 mg via ORAL
  Filled 2017-08-04: qty 1

## 2017-08-04 MED ORDER — PSEUDOEPH-BROMPHEN-DM 30-2-10 MG/5ML PO SYRP: 5.0000 mL | ORAL_SOLUTION | Freq: Four times a day (QID) | ORAL | 0 refills | Status: DC | PRN

## 2017-08-04 NOTE — ED Notes (Signed)
Pt reports flu-like symptoms for the last several days and is not feeling better. Pt reports has been taking a mixture of OTC meds to feel better in rotation but is not feeling better.

## 2017-08-04 NOTE — ED Notes (Signed)
Pt reports has been eating soup but this am felt dizzy and weak.

## 2017-08-04 NOTE — ED Provider Notes (Signed)
[facility] Emergency Department Provider Note   ____________________________________________   First MD Initiated Contact with Patient 08/04/17 1536     (approximate)  I have reviewed the triage vital signs and the nursing notes.   HISTORY  Chief Complaint Influenza    HPI Debra Owen is a 52 y.o. female patient complain of body aches, headache, nasal congestion, sore throat, and cough for 2 days.  Patient states nausea but denies vomiting or diarrhea.  Patient rates pain as a 5/10.  Patient described the pain is "ache".  No palliative measures for complaint.  Patient had taken a flu shot for this season.  Past Medical History:  Diagnosis Date  . Hypertension     There are no active problems to display for this patient.   Past Surgical History:  Procedure Laterality Date  . CESAREAN SECTION      Prior to Admission medications   Medication Sig Start Date End Date Taking? Authorizing Provider  brompheniramine-pseudoephedrine-DM 30-2-10 MG/5ML syrup Take 5 mLs by mouth 4 (four) times daily as needed. 08/04/17   Joni Reining, PA-C  diazepam (VALIUM) 5 MG tablet Take 1 tablet (5 mg total) by mouth every 8 (eight) hours as needed for muscle spasms. Patient not taking: Reported on 04/03/2017 03/09/16   Emily Filbert, MD  dicyclomine (BENTYL) 20 MG tablet Take 1 tablet (20 mg total) by mouth 3 (three) times daily as needed for spasms. 01/08/15 01/08/16  Myrna Blazer, MD  hydrochlorothiazide (HYDRODIURIL) 25 MG tablet Take 1 tablet (25 mg total) by mouth daily. 04/03/17   Jeanmarie Plant, MD  ibuprofen (ADVIL,MOTRIN) 800 MG tablet Take 1 tablet (800 mg total) by mouth every 8 (eight) hours as needed. Patient not taking: Reported on 04/03/2017 01/10/15   Emily Filbert, MD  ibuprofen (ADVIL,MOTRIN) 800 MG tablet Take 1 tablet (800 mg total) by mouth every 8 (eight) hours as needed for moderate pain. 08/04/17   Joni Reining, PA-C    meclizine (ANTIVERT) 25 MG tablet Take 1 tablet (25 mg total) by mouth 3 (three) times daily as needed. 04/03/17   Jeanmarie Plant, MD  ondansetron (ZOFRAN) 4 MG tablet Take 1 tablet (4 mg total) by mouth daily as needed for nausea or vomiting. Patient not taking: Reported on 04/03/2017 01/08/15   Myrna Blazer, MD  ondansetron (ZOFRAN) 8 MG tablet Take 1 tablet (8 mg total) by mouth every 8 (eight) hours as needed for nausea or vomiting. 08/04/17   Joni Reining, PA-C  penicillin v potassium (VEETID) 250 MG tablet Take 1 tablet (250 mg total) by mouth 4 (four) times daily. Patient not taking: Reported on 04/03/2017 01/10/15   Emily Filbert, MD  predniSONE (STERAPRED UNI-PAK 21 TAB) 10 MG (21) TBPK tablet Take 1 tablet (10 mg total) by mouth daily. Take 6 tablets on the first day, take 5 tablets on the second day, take 4 tablets on the third day, take 3 tablets on the fourth day, take 2 tablets on the 5th day, take 1 tablet on the 6th day. Patient not taking: Reported on 04/03/2017 06/16/16   Orvil Feil, PA-C    Allergies Morphine and related and Sulfa antibiotics  No family history on file.  Social History Social History   Tobacco Use  . Smoking status: Current Every Day Smoker    Packs/day: 0.25    Types: Cigarettes  . Smokeless tobacco: Never Used  Substance Use Topics  . Alcohol  use: No  . Drug use: No    Review of Systems Constitutional: No fever/chills Eyes: No visual changes. ENT: No sore throat. Cardiovascular: Denies chest pain. Respiratory: Denies shortness of breath. Gastrointestinal: No abdominal pain.  Nausea without vomiting.  No diarrhea.  No constipation. Genitourinary: Negative for dysuria. Musculoskeletal: Negative for back pain. Skin: Negative for rash. Neurological: Positive for headaches, but denies focal weakness or numbness. Endocrine:Hypertension Allergic/Immunilogical: Morphine and sulfa  antibiotics ____________________________________________   PHYSICAL EXAM:  VITAL SIGNS: ED Triage Vitals  Enc Vitals Group     BP 08/04/17 1416 (!) 150/66     Pulse Rate 08/04/17 1416 89     Resp 08/04/17 1416 18     Temp 08/04/17 1416 98.6 F (37 C)     Temp Source 08/04/17 1416 Oral     SpO2 08/04/17 1416 97 %     Weight 08/04/17 1418 300 lb (136.1 kg)     Height 08/04/17 1418 5\' 5"  (1.651 m)     Head Circumference --      Peak Flow --      Pain Score --      Pain Loc --      Pain Edu? --      Excl. in GC? --    Constitutional: Alert and oriented. Well appearing and in no acute distress. Neck: No stridor.  No cervical spine tenderness to palpation. Hematological/Lymphatic/Immunilogical: No cervical lymphadenopathy. Cardiovascular: Normal rate, regular rhythm. Grossly normal heart sounds.  Good peripheral circulation. Respiratory: Normal respiratory effort.  No retractions. Lungs CTAB. Gastrointestinal: Soft and nontender. No distention. No abdominal bruits. No CVA tenderness. Musculoskeletal: No lower extremity tenderness nor edema.  No joint effusions. Neurologic:  Normal speech and language. No gross focal neurologic deficits are appreciated. No gait instability. Skin:  Skin is warm, dry and intact. No rash noted. Psychiatric: Mood and affect are normal. Speech and behavior are normal.  ____________________________________________   LABS (all labs ordered are listed, but only abnormal results are displayed)  Labs Reviewed  INFLUENZA PANEL BY PCR (TYPE A & B)   ____________________________________________  EKG   ____________________________________________  RADIOLOGY  ED MD interpretation:    Official radiology report(s): No results found.  ____________________________________________   PROCEDURES  Procedure(s) performed: None  Procedures  Critical Care performed: No  ____________________________________________   INITIAL IMPRESSION /  ASSESSMENT AND PLAN / ED COURSE  As part of my medical decision making, I reviewed the following data within the electronic MEDICAL RECORD NUMBER    Viral respiratory illness.  Discussed with patient that a rapid flu test was negative.  Patient given discharge care instruction work note.  Patient advised take medication as directed.  Patient [facility].      ____________________________________________   FINAL CLINICAL IMPRESSION(S) / ED DIAGNOSES  Final diagnoses:  Flu-like symptoms     ED Discharge Orders        Ordered    brompheniramine-pseudoephedrine-DM 30-2-10 MG/5ML syrup  4 times daily PRN     08/04/17 1659    ibuprofen (ADVIL,MOTRIN) 800 MG tablet  Every 8 hours PRN     08/04/17 1659    ondansetron (ZOFRAN) 8 MG tablet  Every 8 hours PRN     08/04/17 1659       Note:  This document was prepared using Dragon voice recognition software and may include unintentional dictation errors.    Joni ReiningSmith, Tempestt Silba K, PA-C 08/04/17 1703    Daryel NovemberWilliams, Jonathan  E, MD 08/07/17 1402

## 2017-08-04 NOTE — ED Triage Notes (Signed)
x2 days of body aches Clelia Schaumann/headache

## 2017-11-19 ENCOUNTER — Encounter: Payer: Self-pay | Admitting: Emergency Medicine

## 2017-11-19 ENCOUNTER — Emergency Department: Payer: Self-pay

## 2017-11-19 ENCOUNTER — Other Ambulatory Visit: Payer: Self-pay

## 2017-11-19 ENCOUNTER — Emergency Department
Admission: EM | Admit: 2017-11-19 | Discharge: 2017-11-19 | Disposition: A | Discharge status: Home / Self Care | Payer: Self-pay | Attending: Emergency Medicine | Admitting: Emergency Medicine

## 2017-11-19 DIAGNOSIS — I10 Essential (primary) hypertension: Secondary | ICD-10-CM | POA: Insufficient documentation

## 2017-11-19 DIAGNOSIS — F17210 Nicotine dependence, cigarettes, uncomplicated: Secondary | ICD-10-CM | POA: Insufficient documentation

## 2017-11-19 DIAGNOSIS — Z79899 Other long term (current) drug therapy: Secondary | ICD-10-CM | POA: Insufficient documentation

## 2017-11-19 DIAGNOSIS — J189 Pneumonia, unspecified organism: Secondary | ICD-10-CM | POA: Insufficient documentation

## 2017-11-19 LAB — TROPONIN I
Troponin I: <0.03 ng/mL (ref <0.03)
Troponin I: <0.03 ng/mL (ref <0.03)

## 2017-11-19 LAB — CBC
HEMATOCRIT: 33.8 % — AB (ref 35.0–47.0)
HEMOGLOBIN: 11.1 g/dL — AB (ref 12.0–16.0)
MCH: 25.9 pg — AB (ref 26.0–34.0)
MCHC: 32.7 g/dL (ref 32.0–36.0)
MCV: 79.3 fL — AB (ref 80.0–100.0)
Platelets: 252 10*3/uL (ref 150–440)
RBC: 4.27 MIL/uL (ref 3.80–5.20)
RDW: 14.2 % (ref 11.5–14.5)
WBC: 6.9 10*3/uL (ref 3.6–11.0)

## 2017-11-19 MED ORDER — AZITHROMYCIN 500 MG PO TABS
500.0000 mg | ORAL_TABLET | Freq: Once | ORAL | Status: AC
  Administered 2017-11-19: 500 mg via ORAL
  Filled 2017-11-19: qty 1

## 2017-11-19 MED ORDER — HYDROCHLOROTHIAZIDE 25 MG PO TABS: 25.0000 mg | ORAL_TABLET | Freq: Every day | ORAL | 1 refills | Status: DC

## 2017-11-19 MED ORDER — AZITHROMYCIN 250 MG PO TABS: ORAL_TABLET | ORAL | 0 refills | Status: AC

## 2017-11-19 MED ORDER — BENZONATATE 100 MG PO CAPS: 100.0000 mg | ORAL_CAPSULE | Freq: Three times a day (TID) | ORAL | 0 refills | Status: AC | PRN

## 2017-11-19 NOTE — ED Notes (Addendum)
Pt states that around 1:00am she felt like her chest was tightening up on her and that her heart was fluttering. Pt is alert and oriented x 4.

## 2017-11-19 NOTE — ED Notes (Signed)
DC instructions discussed, prescriptions discussed, pt verbalized understanding. Pt ambulatory to lobby without difficulty.

## 2017-11-19 NOTE — ED Triage Notes (Signed)
Patient ambulatory to triage with steady gait, without difficulty or distress noted; pt reports having upper chest tight[facility] and "butterflies" in heart causing a cough; denies hx of same; st symptoms occurred after argument with boyfriend

## 2017-11-19 NOTE — ED Provider Notes (Signed)
[facility] Emergency Department Provider Note    First MD Initiated Contact with Patient 11/19/17 [phone]     (approximate)  I have reviewed the triage vital signs and the nursing notes.   HISTORY  Chief Complaint Palpitations    HPI Debra Owen is a 52 y.o. female presents to the emergency department with acute onset of chest tightness and dyspnea after having a verbal argument with her boyfriend.  Patient states that she also felt "heart palpitations".  Patient denies any symptoms at present other than cough which she states she has had for the past 2 weeks.  Patient denies any fever.  Patient denies any lower extremity pain or swelling.   Past Medical History:  Diagnosis Date  . Hypertension     There are no active problems to display for this patient.   Past Surgical History:  Procedure Laterality Date  . CESAREAN SECTION      Prior to Admission medications   Medication Sig Start Date End Date Taking? Authorizing Provider  brompheniramine-pseudoephedrine-DM 30-2-10 MG/5ML syrup Take 5 mLs by mouth 4 (four) times daily as needed. 08/04/17   Joni ReiningSmith, Ronald K, PA-C  diazepam (VALIUM) 5 MG tablet Take 1 tablet (5 mg total) by mouth every 8 (eight) hours as needed for muscle spasms. Patient not taking: Reported on 04/03/2017 03/09/16   Emily FilbertWilliams, Jonathan E, MD  dicyclomine (BENTYL) 20 MG tablet Take 1 tablet (20 mg total) by mouth 3 (three) times daily as needed for spasms. 01/08/15 01/08/16  Myrna BlazerSchaevitz, David Matthew, MD  hydrochlorothiazide (HYDRODIURIL) 25 MG tablet Take 1 tablet (25 mg total) by mouth daily. 04/03/17   Jeanmarie PlantMcShane, James A, MD  ibuprofen (ADVIL,MOTRIN) 800 MG tablet Take 1 tablet (800 mg total) by mouth every 8 (eight) hours as needed. Patient not taking: Reported on 04/03/2017 01/10/15   Emily FilbertWilliams, Jonathan E, MD  ibuprofen (ADVIL,MOTRIN) 800 MG tablet Take 1 tablet (800 mg total) by mouth every 8 (eight) hours as needed for moderate pain.  08/04/17   Joni ReiningSmith, Ronald K, PA-C  meclizine (ANTIVERT) 25 MG tablet Take 1 tablet (25 mg total) by mouth 3 (three) times daily as needed. 04/03/17   Jeanmarie PlantMcShane, James A, MD  ondansetron (ZOFRAN) 4 MG tablet Take 1 tablet (4 mg total) by mouth daily as needed for nausea or vomiting. Patient not taking: Reported on 04/03/2017 01/08/15   Myrna BlazerSchaevitz, David Matthew, MD  ondansetron (ZOFRAN) 8 MG tablet Take 1 tablet (8 mg total) by mouth every 8 (eight) hours as needed for nausea or vomiting. 08/04/17   Joni ReiningSmith, Ronald K, PA-C  penicillin v potassium (VEETID) 250 MG tablet Take 1 tablet (250 mg total) by mouth 4 (four) times daily. Patient not taking: Reported on 04/03/2017 01/10/15   Emily FilbertWilliams, Jonathan E, MD  predniSONE (STERAPRED UNI-PAK 21 TAB) 10 MG (21) TBPK tablet Take 1 tablet (10 mg total) by mouth daily. Take 6 tablets on the first day, take 5 tablets on the second day, take 4 tablets on the third day, take 3 tablets on the fourth day, take 2 tablets on the 5th day, take 1 tablet on the 6th day. Patient not taking: Reported on 04/03/2017 06/16/16   Orvil FeilWoods, Jaclyn M, PA-C    Allergies Morphine and related and Sulfa antibiotics  No family history on file.  Social History Social History   Tobacco Use  . Smoking status: Current Every Day Smoker    Packs/day: 0.25    Types: Cigarettes  . Smokeless tobacco:  Never Used  Substance Use Topics  . Alcohol use: No  . Drug use: No    Review of Systems Constitutional: No fever/chills Eyes: No visual changes. ENT: No sore throat. Cardiovascular: Positive for chest tightness (now resolved) Respiratory: Positive for shortness of breath. Gastrointestinal: No abdominal pain.  No nausea, no vomiting.  No diarrhea.  No constipation. Genitourinary: Negative for dysuria. Musculoskeletal: Negative for neck pain.  Negative for back pain. Integumentary: Negative for rash. Neurological: Negative for headaches, focal weakness or  numbness.   ____________________________________________   PHYSICAL EXAM:  VITAL SIGNS: ED Triage Vitals  Enc Vitals Group     BP 11/19/17 0304 (!) 146/94     Pulse Rate 11/19/17 0304 (!) 104     Resp 11/19/17 0304 16     Temp 11/19/17 0304 98.2 F (36.8 C)     Temp Source 11/19/17 0304 Oral     SpO2 11/19/17 0304 97 %     Weight 11/19/17 0309 129.3 kg (285 lb)     Height 11/19/17 0309 1.651 m (5\' 5" )     Head Circumference --      Peak Flow --      Pain Score 11/19/17 0309 0     Pain Loc --      Pain Edu? --      Excl. in GC? --     Constitutional: Alert and oriented. Well appearing and in no acute distress.  Coughing Eyes: Conjunctivae are normal.  Head: Atraumatic. Mouth/Throat: Mucous membranes are moist.  Oropharynx non-erythematous. Neck: No stridor.   Cardiovascular: Normal rate, regular rhythm. Good peripheral circulation. Grossly normal heart sounds. Respiratory: Normal respiratory effort.  No retractions.  Bibasilar rhonchi Gastrointestinal: Soft and nontender. No distention.  Musculoskeletal: No lower extremity tenderness nor edema. No gross deformities of extremities. Neurologic:  Normal speech and language. No gross focal neurologic deficits are appreciated.  Skin:  Skin is warm, dry and intact. No rash noted. Psychiatric: Mood and affect are normal. Speech and behavior are normal.  ____________________________________________   LABS (all labs ordered are listed, but only abnormal results are displayed)  Labs Reviewed  CBC - Abnormal; Notable for the following components:      Result Value   Hemoglobin 11.1 (*)    HCT 33.8 (*)    MCV 79.3 (*)    MCH 25.9 (*)    All other components within normal limits  TROPONIN I   ____________________________________________  EKG  ED ECG REPORT I, [place] N BROWN, the attending physician, personally viewed and interpreted this ECG.   Date: 11/19/2017  EKG Time: 3:08 AM  Rate: 98  Rhythm: Normal sinus  rhythm  Axis: Normal  Intervals: Normal  ST&T Change: None  ____________________________________________  RADIOLOGY I,  N BROWN, personally viewed and evaluated these images (plain radiographs) as part of my medical decision making, as well as reviewing the written report by the radiologist.  ED MD interpretation: Minimal bibasilar airspace opacities may reflect pneumonia per radiologist.  Official radiology report(s): Dg Chest Port 1 View  Result Date: 11/19/2017 CLINICAL DATA:  Acute onset of upper chest tightness and shortness of breath. Cough. EXAM: PORTABLE CHEST 1 VIEW COMPARISON:  Chest radiograph performed 04/14/2012 FINDINGS: The lungs are well-aerated. Minimal bibasilar airspace opacities may reflect pneumonia. There is no evidence of pleural effusion or pneumothorax. The cardiomediastinal silhouette is within normal limits. No acute osseous abnormalities are seen. IMPRESSION: Minimal bibasilar airspace opacities may reflect pneumonia. Electronically Signed   By: Roanna Raider  M.D.   On: 11/19/2017 03:55     Procedures   ____________________________________________   INITIAL IMPRESSION / ASSESSMENT AND PLAN / ED COURSE  As part of my medical decision making, I reviewed the following data within the electronic MEDICAL RECORD NUMBER   52 year old female presented with above-stated history and physical exam secondary to chest tightness following verbal argument with her boyfriend.  In addition patient with 2-week history of a productive cough.  Chest x-ray concern for possible pneumonia as such patient given azithromycin in emergency department will be prescribed same for home as well as Tessalon Perles. ____________________________________________  FINAL CLINICAL IMPRESSION(S) / ED DIAGNOSES  Final diagnoses:  Community acquired pneumonia, unspecified laterality     MEDICATIONS GIVEN DURING THIS VISIT:  Medications  azithromycin (ZITHROMAX) tablet 500 mg (500 mg  Oral Given 11/19/17 0513)     ED Discharge Orders    None       Note:  This document was prepared using Dragon voice recognition software and may include unintentional dictation errors.    Darci Current, MD 11/19/17 [phone]

## 2018-02-09 ENCOUNTER — Other Ambulatory Visit: Payer: Self-pay

## 2018-02-09 ENCOUNTER — Encounter: Payer: Self-pay | Admitting: Emergency Medicine

## 2018-02-09 ENCOUNTER — Emergency Department
Admission: EM | Admit: 2018-02-09 | Discharge: 2018-02-09 | Disposition: A | Discharge status: Home / Self Care | Payer: Self-pay | Attending: Emergency Medicine | Admitting: Emergency Medicine

## 2018-02-09 ENCOUNTER — Emergency Department: Payer: Self-pay

## 2018-02-09 DIAGNOSIS — M1611 Unilateral primary osteoarthritis, right hip: Secondary | ICD-10-CM | POA: Insufficient documentation

## 2018-02-09 DIAGNOSIS — I10 Essential (primary) hypertension: Secondary | ICD-10-CM | POA: Insufficient documentation

## 2018-02-09 DIAGNOSIS — Z79899 Other long term (current) drug therapy: Secondary | ICD-10-CM | POA: Insufficient documentation

## 2018-02-09 MED ORDER — MELOXICAM 15 MG PO TABS: 15.0000 mg | ORAL_TABLET | Freq: Every day | ORAL | 0 refills | Status: DC

## 2018-02-09 MED ORDER — MELOXICAM 7.5 MG PO TABS
15.0000 mg | ORAL_TABLET | Freq: Once | ORAL | Status: AC
  Administered 2018-02-09: 15 mg via ORAL
  Filled 2018-02-09: qty 2

## 2018-02-09 NOTE — ED Provider Notes (Signed)
[facility] Emergency Department Provider Note ____________________________________________  Time seen: Approximately 5:47 PM  I have reviewed the triage vital signs and the nursing notes.   HISTORY  Chief Complaint Leg Pain and Groin Pain    HPI Debra Owen is a 52 y.o. female who presents to the emergency department for evaluation and treatment of right groin pain with radiation into the right lower extremity that is been present for the approximately 2 months.  Pain worsens with lying down.  No relief with ibuprofen and over-the-counter arthritis medications.  No specific injury.  Past Medical History:  Diagnosis Date  . Hypertension     There are no active problems to display for this patient.   Past Surgical History:  Procedure Laterality Date  . CESAREAN SECTION      Prior to Admission medications   Medication Sig Start Date End Date Taking? Authorizing Provider  brompheniramine-pseudoephedrine-DM 30-2-10 MG/5ML syrup Take 5 mLs by mouth 4 (four) times daily as needed. 08/04/17   Joni ReiningSmith, Ronald K, PA-C  diazepam (VALIUM) 5 MG tablet Take 1 tablet (5 mg total) by mouth every 8 (eight) hours as needed for muscle spasms. Patient not taking: Reported on 04/03/2017 03/09/16   Emily FilbertWilliams, Jonathan E, MD  dicyclomine (BENTYL) 20 MG tablet Take 1 tablet (20 mg total) by mouth 3 (three) times daily as needed for spasms. 01/08/15 01/08/16  Myrna BlazerSchaevitz, David Matthew, MD  hydrochlorothiazide (HYDRODIURIL) 25 MG tablet Take 1 tablet (25 mg total) by mouth daily. 04/03/17   Jeanmarie PlantMcShane, James A, MD  hydrochlorothiazide (HYDRODIURIL) 25 MG tablet Take 1 tablet (25 mg total) by mouth daily. 11/19/17   Emily FilbertWilliams, Jonathan E, MD  meclizine (ANTIVERT) 25 MG tablet Take 1 tablet (25 mg total) by mouth 3 (three) times daily as needed. 04/03/17   Jeanmarie PlantMcShane, James A, MD  meloxicam (MOBIC) 15 MG tablet Take 1 tablet (15 mg total) by mouth daily. 02/09/18   Masha Orbach B, FNP   ondansetron (ZOFRAN) 4 MG tablet Take 1 tablet (4 mg total) by mouth daily as needed for nausea or vomiting. Patient not taking: Reported on 04/03/2017 01/08/15   Myrna BlazerSchaevitz, David Matthew, MD  ondansetron (ZOFRAN) 8 MG tablet Take 1 tablet (8 mg total) by mouth every 8 (eight) hours as needed for nausea or vomiting. 08/04/17   Joni ReiningSmith, Ronald K, PA-C  penicillin v potassium (VEETID) 250 MG tablet Take 1 tablet (250 mg total) by mouth 4 (four) times daily. Patient not taking: Reported on 04/03/2017 01/10/15   Emily FilbertWilliams, Jonathan E, MD  predniSONE (STERAPRED UNI-PAK 21 TAB) 10 MG (21) TBPK tablet Take 1 tablet (10 mg total) by mouth daily. Take 6 tablets on the first day, take 5 tablets on the second day, take 4 tablets on the third day, take 3 tablets on the fourth day, take 2 tablets on the 5th day, take 1 tablet on the 6th day. Patient not taking: Reported on 04/03/2017 06/16/16   Orvil FeilWoods, Jaclyn M, PA-C    Allergies Morphine and related and Sulfa antibiotics  No family history on file.  Social History Social History   Tobacco Use  . Smoking status: Current Every Day Smoker    Packs/day: 0.25    Types: Cigarettes  . Smokeless tobacco: Never Used  Substance Use Topics  . Alcohol use: No  . Drug use: No    Review of Systems Constitutional: Negative for fever. Cardiovascular: Negative for chest pain. Respiratory: Negative for shortness of breath. Musculoskeletal: Positive for right hip  pain. Skin: Negative for wound or lesion in the area of pain on the right groin Neurological: Negative for decrease in sensation  ____________________________________________   PHYSICAL EXAM:  VITAL SIGNS: ED Triage Vitals  Enc Vitals Group     BP 02/09/18 1727 131/73     Pulse Rate 02/09/18 1727 80     Resp --      Temp 02/09/18 1727 98.3 F (36.8 C)     Temp Source 02/09/18 1727 Oral     SpO2 02/09/18 1727 100 %     Weight 02/09/18 1732 283 lb (128.4 kg)     Height 02/09/18 1732 5\' 5"  (1.651  m)     Head Circumference --      Peak Flow --      Pain Score 02/09/18 1731 7     Pain Loc --      Pain Edu? --      Excl. in GC? --     Constitutional: Alert and oriented. Well appearing and in no acute distress. Eyes: Conjunctivae are clear without discharge or drainage Head: Atraumatic Neck: Supple Respiratory: No cough. Respirations are even and unlabored. Musculoskeletal: Limited external rotation of the right hip secondary to pain.  No shortening or physiologic rotation of the right lower extremity. Neurologic: No radiculopathy in the right lower extremity elicited with straight leg raise Skin: Intact Psychiatric: Affect and behavior are appropriate.  ____________________________________________   LABS (all labs ordered are listed, but only abnormal results are displayed)  Labs Reviewed - No data to display ____________________________________________  RADIOLOGY  Image of the right hip and pelvis shows:   Osteoarthritis of both hips, right worse than left with  subchondral cystic change noted of the weight-bearing portion of  both femoral heads with sclerosis and right hip joint capsular  calcifications.    ____________________________________________   PROCEDURES  Procedures  ____________________________________________   INITIAL IMPRESSION / ASSESSMENT AND PLAN / ED COURSE  Debra Owen is a 52 y.o. who presents to the emergency department for evaluation of right groin and hip pain.  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  X-ray shows no acute findings but does show significant arthritis. She will be treated with meloxicam and encouraged to follow up with orthopedics. She will be provided a work excuse for the next 2 days as well.  Patient is to return to the emergency department for symptoms that change, worsen, or with new  concerns if she is unable to schedule an appointment with orthopedics or primary care provider.  Medications  meloxicam (MOBIC) tablet 15 mg (has no administration in time range)    Pertinent labs & imaging results that were available during my care of the patient were reviewed by me and considered in my medical decision making (see chart for details).  _________________________________________   FINAL CLINICAL IMPRESSION(S) / ED DIAGNOSES  Final diagnoses:  Osteoarthritis of right hip, unspecified osteoarthritis type    ED Discharge Orders         Ordered    meloxicam (MOBIC) 15 MG tablet  Daily     02/09/18 1918           If controlled substance prescribed during this visit, 12 month history viewed on the [facility] prior to issuing an initial prescription for Schedule II or III opiod.    Chinita Pester, FNP 02/09/18 1924  Merrily Brittle, MD 02/10/18 [phone]

## 2018-02-09 NOTE — ED Triage Notes (Signed)
Pt in via POV with complaints of right groin pain w/ radiation into right leg x approximately 2 months, pain worse with laying.  Pt reports using Ibuprofen and otc arthritis medication without any relief.  Pt ambulatory to triage without difficulty, NAD noted at this time.

## 2018-02-09 NOTE — ED Notes (Signed)
Pt to the ER for groin pain that she has been treating at home without success. Pt has been taking motrin and iron at home and states she has bruising now to the groin. Pt googled symptoms and became worried.

## 2018-03-25 ENCOUNTER — Emergency Department
Admission: EM | Admit: 2018-03-25 | Discharge: 2018-03-25 | Disposition: A | Discharge status: Home / Self Care | Payer: Self-pay | Attending: Emergency Medicine | Admitting: Emergency Medicine

## 2018-03-25 ENCOUNTER — Emergency Department: Payer: Self-pay

## 2018-03-25 ENCOUNTER — Other Ambulatory Visit: Payer: Self-pay

## 2018-03-25 DIAGNOSIS — J40 Bronchitis, not specified as acute or chronic: Secondary | ICD-10-CM | POA: Insufficient documentation

## 2018-03-25 DIAGNOSIS — F17210 Nicotine dependence, cigarettes, uncomplicated: Secondary | ICD-10-CM | POA: Insufficient documentation

## 2018-03-25 DIAGNOSIS — R05 Cough: Secondary | ICD-10-CM

## 2018-03-25 DIAGNOSIS — Z79899 Other long term (current) drug therapy: Secondary | ICD-10-CM | POA: Insufficient documentation

## 2018-03-25 DIAGNOSIS — I10 Essential (primary) hypertension: Secondary | ICD-10-CM | POA: Insufficient documentation

## 2018-03-25 DIAGNOSIS — R059 Cough, unspecified: Secondary | ICD-10-CM

## 2018-03-25 LAB — POCT PREGNANCY, URINE: Preg Test, Ur: NEGATIVE

## 2018-03-25 MED ORDER — ALBUTEROL SULFATE HFA 108 (90 BASE) MCG/ACT IN AERS: 2.0000 | INHALATION_SPRAY | Freq: Four times a day (QID) | RESPIRATORY_TRACT | 2 refills | Status: AC | PRN

## 2018-03-25 MED ORDER — PREDNISONE 20 MG PO TABS
60.0000 mg | ORAL_TABLET | Freq: Once | ORAL | Status: AC
  Administered 2018-03-25: 60 mg via ORAL
  Filled 2018-03-25: qty 3

## 2018-03-25 MED ORDER — PREDNISONE 10 MG PO TABS: 10.0000 mg | ORAL_TABLET | Freq: Every day | ORAL | 0 refills | Status: DC

## 2018-03-25 MED ORDER — HYDROCOD POLST-CPM POLST ER 10-8 MG/5ML PO SUER: 5.0000 mL | Freq: Every evening | ORAL | 0 refills | Status: DC | PRN

## 2018-03-25 NOTE — ED Triage Notes (Signed)
Pt to the ER for cough that pt feels productive and is unable to get anything up. Pt says chest is sore. DX with [facility] in May.

## 2018-03-25 NOTE — Discharge Instructions (Signed)
Please continue with Mucinex daily.  Make sure you are drinking lots of fluids.  Please take prednisone, albuterol and nighttime cough medication as prescribed.  If any fevers, worsening cough return to the emergency department.

## 2018-03-25 NOTE — ED Provider Notes (Signed)
[facility] EMERGENCY DEPARTMENT Provider Note   CSN: [id number] Arrival date & time: 03/25/18  1600     History   Chief Complaint Chief Complaint  Patient presents with  . Cough    HPI Debra Owen is a 52 y.o. female presents emerge department for evaluation of cough x2 weeks.  She denies any fevers.  Cough is dry.  She is been taking Mucinex with little relief.  She denies any headache, sore throat, nausea vomiting or diarrhea.  No abdominal pain chest pain or shortness of breath. HPI  Past Medical History:  Diagnosis Date  . Hypertension     There are no active problems to display for this patient.   Past Surgical History:  Procedure Laterality Date  . CESAREAN SECTION       OB History   None      Home Medications    Prior to Admission medications   Medication Sig Start Date End Date Taking? Authorizing Provider  albuterol (PROVENTIL HFA;VENTOLIN HFA) 108 (90 Base) MCG/ACT inhaler Inhale 2 puffs into the lungs every 6 (six) hours as needed for wheezing or shortness of breath. 03/25/18   Evon Slack, PA-C  brompheniramine-pseudoephedrine-DM 30-2-10 MG/5ML syrup Take 5 mLs by mouth 4 (four) times daily as needed. 08/04/17   Joni Reining, PA-C  chlorpheniramine-HYDROcodone (TUSSIONEX PENNKINETIC ER) 10-8 MG/5ML SUER Take 5 mLs by mouth at bedtime as needed for cough. 03/25/18   Evon Slack, PA-C  diazepam (VALIUM) 5 MG tablet Take 1 tablet (5 mg total) by mouth every 8 (eight) hours as needed for muscle spasms. Patient not taking: Reported on 04/03/2017 03/09/16   Emily Filbert, MD  dicyclomine (BENTYL) 20 MG tablet Take 1 tablet (20 mg total) by mouth 3 (three) times daily as needed for spasms. 01/08/15 01/08/16  Myrna Blazer, MD  hydrochlorothiazide (HYDRODIURIL) 25 MG tablet Take 1 tablet (25 mg total) by mouth daily. 04/03/17   Jeanmarie Plant, MD  hydrochlorothiazide (HYDRODIURIL) 25 MG tablet Take 1 tablet  (25 mg total) by mouth daily. 11/19/17   Emily Filbert, MD  meclizine (ANTIVERT) 25 MG tablet Take 1 tablet (25 mg total) by mouth 3 (three) times daily as needed. 04/03/17   Jeanmarie Plant, MD  meloxicam (MOBIC) 15 MG tablet Take 1 tablet (15 mg total) by mouth daily. 02/09/18   Triplett, Cari B, FNP  ondansetron (ZOFRAN) 4 MG tablet Take 1 tablet (4 mg total) by mouth daily as needed for nausea or vomiting. Patient not taking: Reported on 04/03/2017 01/08/15   Myrna Blazer, MD  ondansetron (ZOFRAN) 8 MG tablet Take 1 tablet (8 mg total) by mouth every 8 (eight) hours as needed for nausea or vomiting. 08/04/17   Joni Reining, PA-C  penicillin v potassium (VEETID) 250 MG tablet Take 1 tablet (250 mg total) by mouth 4 (four) times daily. Patient not taking: Reported on 04/03/2017 01/10/15   Emily Filbert, MD  predniSONE (DELTASONE) 10 MG tablet Take 1 tablet (10 mg total) by mouth daily. 6,5,4,3,2,1 six day taper 03/25/18   Evon Slack, PA-C    Family History No family history on file.  Social History Social History   Tobacco Use  . Smoking status: Current Every Day Smoker    Packs/day: 0.25    Types: Cigarettes  . Smokeless tobacco: Never Used  Substance Use Topics  . Alcohol use: No  . Drug use: No     Allergies  Morphine and related and Sulfa antibiotics   Review of Systems Review of Systems  Constitutional: Negative for fever.  HENT: Positive for rhinorrhea. Negative for congestion, ear discharge, sinus pressure, sinus pain, sore throat, trouble swallowing and voice change.   Respiratory: Positive for cough. Negative for shortness of breath, wheezing and stridor.   Cardiovascular: Negative for chest pain.  Gastrointestinal: Negative for abdominal pain, diarrhea, nausea and vomiting.  Genitourinary: Negative for dysuria, flank pain and pelvic pain.  Musculoskeletal: Negative for back pain and myalgias.  Skin: Negative for rash.  Neurological:  Negative for dizziness and headaches.     Physical Exam Updated Vital Signs BP 129/75 (BP Location: Left Arm)   Pulse 78   Temp 98.2 F (36.8 C) (Oral)   Resp 18   Ht 5\' 5"  (1.651 m)   Wt 131.5 kg   LMP 03/09/2018   SpO2 98%   BMI 48.26 kg/m   Physical Exam  Constitutional: She is oriented to person, place, and time. She appears well-developed and well-nourished. No distress.  HENT:  Head: Normocephalic and atraumatic.  Right Ear: Hearing, tympanic membrane, external ear and ear canal normal.  Left Ear: Hearing, tympanic membrane, external ear and ear canal normal.  Nose: Rhinorrhea present.  Mouth/Throat: Mucous membranes are normal. No trismus in the jaw. No uvula swelling. Posterior oropharyngeal erythema present. No oropharyngeal exudate, posterior oropharyngeal edema or tonsillar abscesses. No tonsillar exudate.  Eyes: Conjunctivae are normal. Right eye exhibits no discharge. Left eye exhibits no discharge.  Neck: Normal range of motion.  Cardiovascular: Normal rate and regular rhythm.  Pulmonary/Chest: Effort normal and breath sounds normal. No stridor. No respiratory distress. She has no wheezes. She has no rales.  Abdominal: Soft. She exhibits no distension. There is no tenderness.  Musculoskeletal: Normal range of motion. She exhibits no deformity.  Lymphadenopathy:    She has no cervical adenopathy.  Neurological: She is alert and oriented to person, place, and time. She has normal reflexes.  Skin: Skin is warm and dry.  Psychiatric: She has a normal mood and affect. Her behavior is normal. Thought content normal.     ED Treatments / Results  Labs (all labs ordered are listed, but only abnormal results are displayed) Labs Reviewed  POCT PREGNANCY, URINE  POC URINE PREG, ED    EKG None  Radiology Dg Chest 2 View  Result Date: 03/25/2018 CLINICAL DATA:  Cough EXAM: CHEST - 2 VIEW COMPARISON:  November 19, 2017 FINDINGS: There is slight scarring in the lateral  left base region. There is no edema or consolidation. The heart size and pulmonary vascularity are normal. No adenopathy. No bone lesions. IMPRESSION: Mild scarring left base. No edema or consolidation. Heart size normal. Electronically Signed   By: Bretta Bang III M.D.   On: 03/25/2018 17:01    Procedures Procedures (including critical care time)  Medications Ordered in ED Medications  predniSONE (DELTASONE) tablet 60 mg (60 mg Oral Given 03/25/18 1808)     Initial Impression / Assessment and Plan / ED Course  I have reviewed the triage vital signs and the nursing notes.  Pertinent labs & imaging results that were available during my care of the patient were reviewed by me and considered in my medical decision making (see chart for details).    52 year old female with cough x2 weeks.  Cough dry nonproductive without fever.  Symptoms consistent with bronchitis.  Chest x-ray negative showing no infiltrates.  Vital signs are stable, afebrile.  She is  treated with prednisone, albuterol and cough medication.  She understands signs symptoms return to the ED for.  Final Clinical Impressions(s) / ED Diagnoses   Final diagnoses:  Bronchitis  Cough    ED Discharge Orders         Ordered    predniSONE (DELTASONE) 10 MG tablet  Daily     03/25/18 1816    albuterol (PROVENTIL HFA;VENTOLIN HFA) 108 (90 Base) MCG/ACT inhaler  Every 6 hours PRN     03/25/18 1816    chlorpheniramine-HYDROcodone (TUSSIONEX PENNKINETIC ER) 10-8 MG/5ML SUER  At bedtime PRN     03/25/18 1816           Evon Slack, PA-C 03/25/18 Durene Cal, [place], MD 03/30/18 [phone]

## 2018-03-25 NOTE — ED Notes (Signed)
See triage note  presents with cough for couple of days   Chest congestion and feels like the cough is prod but unable to bring up any phlegm afebrile on arrival

## 2018-04-01 DIAGNOSIS — Z9851 Tubal ligation status: Secondary | ICD-10-CM | POA: Insufficient documentation

## 2018-06-17 ENCOUNTER — Other Ambulatory Visit: Payer: Self-pay

## 2018-06-17 ENCOUNTER — Encounter: Payer: Self-pay | Admitting: Emergency Medicine

## 2018-06-17 ENCOUNTER — Emergency Department
Admission: EM | Admit: 2018-06-17 | Discharge: 2018-06-17 | Disposition: A | Discharge status: Home / Self Care | Payer: Self-pay | Attending: Emergency Medicine | Admitting: Emergency Medicine

## 2018-06-17 DIAGNOSIS — F17210 Nicotine dependence, cigarettes, uncomplicated: Secondary | ICD-10-CM | POA: Insufficient documentation

## 2018-06-17 DIAGNOSIS — J0100 Acute maxillary sinusitis, unspecified: Secondary | ICD-10-CM | POA: Insufficient documentation

## 2018-06-17 DIAGNOSIS — Z79899 Other long term (current) drug therapy: Secondary | ICD-10-CM | POA: Insufficient documentation

## 2018-06-17 DIAGNOSIS — I10 Essential (primary) hypertension: Secondary | ICD-10-CM | POA: Insufficient documentation

## 2018-06-17 DIAGNOSIS — B349 Viral infection, unspecified: Secondary | ICD-10-CM | POA: Insufficient documentation

## 2018-06-17 MED ORDER — FLUTICASONE PROPIONATE 50 MCG/ACT NA SUSP: 1.0000 | Freq: Two times a day (BID) | NASAL | 0 refills | Status: DC

## 2018-06-17 MED ORDER — AMOXICILLIN-POT CLAVULANATE 875-125 MG PO TABS: 1.0000 | ORAL_TABLET | Freq: Two times a day (BID) | ORAL | 0 refills | Status: DC

## 2018-06-17 MED ORDER — PSEUDOEPH-BROMPHEN-DM 30-2-10 MG/5ML PO SYRP: 10.0000 mL | ORAL_SOLUTION | Freq: Four times a day (QID) | ORAL | 0 refills | Status: AC | PRN

## 2018-06-17 MED ORDER — HYDROCHLOROTHIAZIDE 25 MG PO TABS: 25.0000 mg | ORAL_TABLET | Freq: Every day | ORAL | 3 refills | Status: AC

## 2018-06-17 NOTE — ED Triage Notes (Signed)
PT c/o body aches, chills, headache xfew days. Pt states household has same like symptoms.

## 2018-06-17 NOTE — ED Provider Notes (Signed)
[facility] Emergency Department Provider Note  ____________________________________________  Time seen: Approximately 7:49 PM  I have reviewed the triage vital signs and the nursing notes.   HISTORY  Chief Complaint No chief complaint on file.    HPI Debra Owen is a 53 y.o. female who presents the emergency department with multiple complaints.  Patient presents complaining of nasal congestion, sore throat, cough, diarrhea.  Patient reports that she started off with a "cold" and symptoms have progressed.  Patient's main complaint right now is sinus pressure and diarrhea.  She has had fevers and chills, body aches.  She denies any headache, visual changes, neck pain or stiffness, chest pain, emesis.  Patient is tried multiple over-the-counter medications with no relief.  No other complaints at this time.    Past Medical History:  Diagnosis Date  . Hypertension     There are no active problems to display for this patient.   Past Surgical History:  Procedure Laterality Date  . CESAREAN SECTION      Prior to Admission medications   Medication Sig Start Date End Date Taking? Authorizing Provider  albuterol (PROVENTIL HFA;VENTOLIN HFA) 108 (90 Base) MCG/ACT inhaler Inhale 2 puffs into the lungs every 6 (six) hours as needed for wheezing or shortness of breath. 03/25/18   Evon Slack, PA-C  amoxicillin-clavulanate (AUGMENTIN) 875-125 MG tablet Take 1 tablet by mouth 2 (two) times daily. 06/17/18   Marit Goodwill, Delorise Royals, PA-C  brompheniramine-pseudoephedrine-DM 30-2-10 MG/5ML syrup Take 10 mLs by mouth 4 (four) times daily as needed. 06/17/18   Vannesa Abair, Delorise Royals, PA-C  chlorpheniramine-HYDROcodone (TUSSIONEX PENNKINETIC ER) 10-8 MG/5ML SUER Take 5 mLs by mouth at bedtime as needed for cough. 03/25/18   Evon Slack, PA-C  diazepam (VALIUM) 5 MG tablet Take 1 tablet (5 mg total) by mouth every 8 (eight) hours as needed for muscle spasms. Patient not  taking: Reported on 04/03/2017 03/09/16   Emily Filbert, MD  dicyclomine (BENTYL) 20 MG tablet Take 1 tablet (20 mg total) by mouth 3 (three) times daily as needed for spasms. 01/08/15 01/08/16  Myrna Blazer, MD  fluticasone (FLONASE) 50 MCG/ACT nasal spray Place 1 spray into both nostrils 2 (two) times daily. 06/17/18   Rivers Gassmann, Delorise Royals, PA-C  hydrochlorothiazide (HYDRODIURIL) 25 MG tablet Take 1 tablet (25 mg total) by mouth daily. 04/03/17   Jeanmarie Plant, MD  hydrochlorothiazide (HYDRODIURIL) 25 MG tablet Take 1 tablet (25 mg total) by mouth daily. 11/19/17   Emily Filbert, MD  hydrochlorothiazide (HYDRODIURIL) 25 MG tablet Take 1 tablet (25 mg total) by mouth daily. 06/17/18   Zacharey Jensen, Delorise Royals, PA-C  meclizine (ANTIVERT) 25 MG tablet Take 1 tablet (25 mg total) by mouth 3 (three) times daily as needed. 04/03/17   Jeanmarie Plant, MD  meloxicam (MOBIC) 15 MG tablet Take 1 tablet (15 mg total) by mouth daily. 02/09/18   Triplett, Cari B, FNP  ondansetron (ZOFRAN) 4 MG tablet Take 1 tablet (4 mg total) by mouth daily as needed for nausea or vomiting. Patient not taking: Reported on 04/03/2017 01/08/15   Myrna Blazer, MD  ondansetron (ZOFRAN) 8 MG tablet Take 1 tablet (8 mg total) by mouth every 8 (eight) hours as needed for nausea or vomiting. 08/04/17   Joni Reining, PA-C  penicillin v potassium (VEETID) 250 MG tablet Take 1 tablet (250 mg total) by mouth 4 (four) times daily. Patient not taking: Reported on 04/03/2017 01/10/15   Mayford Knife,  Cecille AmsterdamJonathan E, MD  predniSONE (DELTASONE) 10 MG tablet Take 1 tablet (10 mg total) by mouth daily. 6,5,4,3,2,1 six day taper 03/25/18   Evon SlackGaines, Thomas C, PA-C    Allergies Morphine and related and Sulfa antibiotics  No family history on file.  Social History Social History   Tobacco Use  . Smoking status: Current Every Day Smoker    Packs/day: 0.25    Types: Cigarettes  . Smokeless tobacco: Never Used   Substance Use Topics  . Alcohol use: No  . Drug use: No     Review of Systems  Constitutional: Positive fever/chills Eyes: No visual changes. No discharge ENT: Positive for nasal congestion and sinus pressure.  Positive for sore throat Cardiovascular: no chest pain. Respiratory: Positive cough. No SOB. Gastrointestinal: No abdominal pain.  No nausea, no vomiting.  Positive diarrhea.  No constipation. Genitourinary: Negative for dysuria. No hematuria Musculoskeletal: Negative for musculoskeletal pain. Skin: Negative for rash, abrasions, lacerations, ecchymosis. Neurological: Negative for headaches, focal weakness or numbness. 10-point ROS otherwise negative.  ____________________________________________   PHYSICAL EXAM:  VITAL SIGNS: ED Triage Vitals [06/17/18 1816]  Enc Vitals Group     BP (!) 149/90     Pulse Rate 73     Resp 16     Temp 98.4 F (36.9 C)     Temp Source Oral     SpO2 98 %     Weight      Height      Head Circumference      Peak Flow      Pain Score 3     Pain Loc      Pain Edu?      Excl. in GC?      Constitutional: Alert and oriented. Well appearing and in no acute distress. Eyes: Conjunctivae are normal. PERRL. EOMI. Head: Atraumatic. ENT:      Ears: EACs and TMs unremarkable bilaterally.      Nose:  moderate congestion/rhinnorhea.  Patient is tender to percussion over the maxillary sinuses.      Mouth/Throat: Mucous membranes are moist.  Oropharynx is nonerythematous and nonedematous.  Uvula is midline. Neck: No stridor.  Neck is supple full range of motion Hematological/Lymphatic/Immunilogical: Scattered, mobile, nontender anterior cervical lymphadenopathy. Cardiovascular: Normal rate, regular rhythm. Normal S1 and S2.  Good peripheral circulation. Respiratory: Normal respiratory effort without tachypnea or retractions. Lungs CTAB. Good air entry to the bases with no decreased or absent breath sounds. Gastrointestinal: Bowel sounds 4  quadrants. Soft and nontender to palpation. No guarding or rigidity. No palpable masses. No distention. No CVA tenderness. Musculoskeletal: Full range of motion to all extremities. No gross deformities appreciated. Neurologic:  Normal speech and language. No gross focal neurologic deficits are appreciated.  Skin:  Skin is warm, dry and intact. No rash noted. Psychiatric: Mood and affect are normal. Speech and behavior are normal. Patient exhibits appropriate insight and judgement.   ____________________________________________   LABS (all labs ordered are listed, but only abnormal results are displayed)  Labs Reviewed - No data to display ____________________________________________  EKG   ____________________________________________  RADIOLOGY   No results found.  ____________________________________________    PROCEDURES  Procedure(s) performed:    Procedures    Medications - No data to display   ____________________________________________   INITIAL IMPRESSION / ASSESSMENT AND PLAN / ED COURSE  Pertinent labs & imaging results that were available during my care of the patient were reviewed by me and considered in my medical decision making (see chart  for details).  Review of the [place] CSRS was performed in accordance of the [facility] prior to dispensing any controlled drugs.      Patient's diagnosis is consistent with viral illness with bacterial sinusitis.  Patient presents the emergency department multiple complaints.  Diagnosis is most consistent with bacterial sinusitis with viral illness.  Patient will be given Augmentin for sinusitis, symptom control medications of Flonase, Bromfed cough syrup.  She will also be given a refill of her HCTZ which she has run out of.  Patient is to take a probiotic for diarrhea..  Follow-up primary care as needed.  Patient is given ED precautions to return to the ED for any worsening or new  symptoms.     ____________________________________________  FINAL CLINICAL IMPRESSION(S) / ED DIAGNOSES  Final diagnoses:  Viral illness  Acute non-recurrent maxillary sinusitis      NEW MEDICATIONS STARTED DURING THIS VISIT:  ED Discharge Orders         Ordered    amoxicillin-clavulanate (AUGMENTIN) 875-125 MG tablet  2 times daily     06/17/18 1953    fluticasone (FLONASE) 50 MCG/ACT nasal spray  2 times daily     06/17/18 1953    brompheniramine-pseudoephedrine-DM 30-2-10 MG/5ML syrup  4 times daily PRN     06/17/18 1953    hydrochlorothiazide (HYDRODIURIL) 25 MG tablet  Daily     06/17/18 1953              This chart was dictated using voice recognition software/Dragon. Despite best efforts to proofread, errors can occur which can change the meaning. Any change was purely unintentional.    Racheal Patches, PA-C 06/17/18 1953    Dionne Bucy, MD 06/17/18 [phone]

## 2018-06-17 NOTE — ED Notes (Signed)
Reports flu like symptom, body aches, headache and nasal congestion with productive cough. Fever last night, with decreased appetite. Marland Kitchen

## 2018-09-28 DIAGNOSIS — Z9851 Tubal ligation status: Secondary | ICD-10-CM

## 2020-02-09 IMAGING — CR DG HIP (WITH OR WITHOUT PELVIS) 2-3V*R*
3 series · 3 of 3 positions shown · non-contrast
Comparison: None.

CLINICAL DATA: Right groin pain radiating to the right leg for 2
months.

EXAM:
DG HIP (WITH OR WITHOUT PELVIS) 2-3V RIGHT

[pelvis ap]
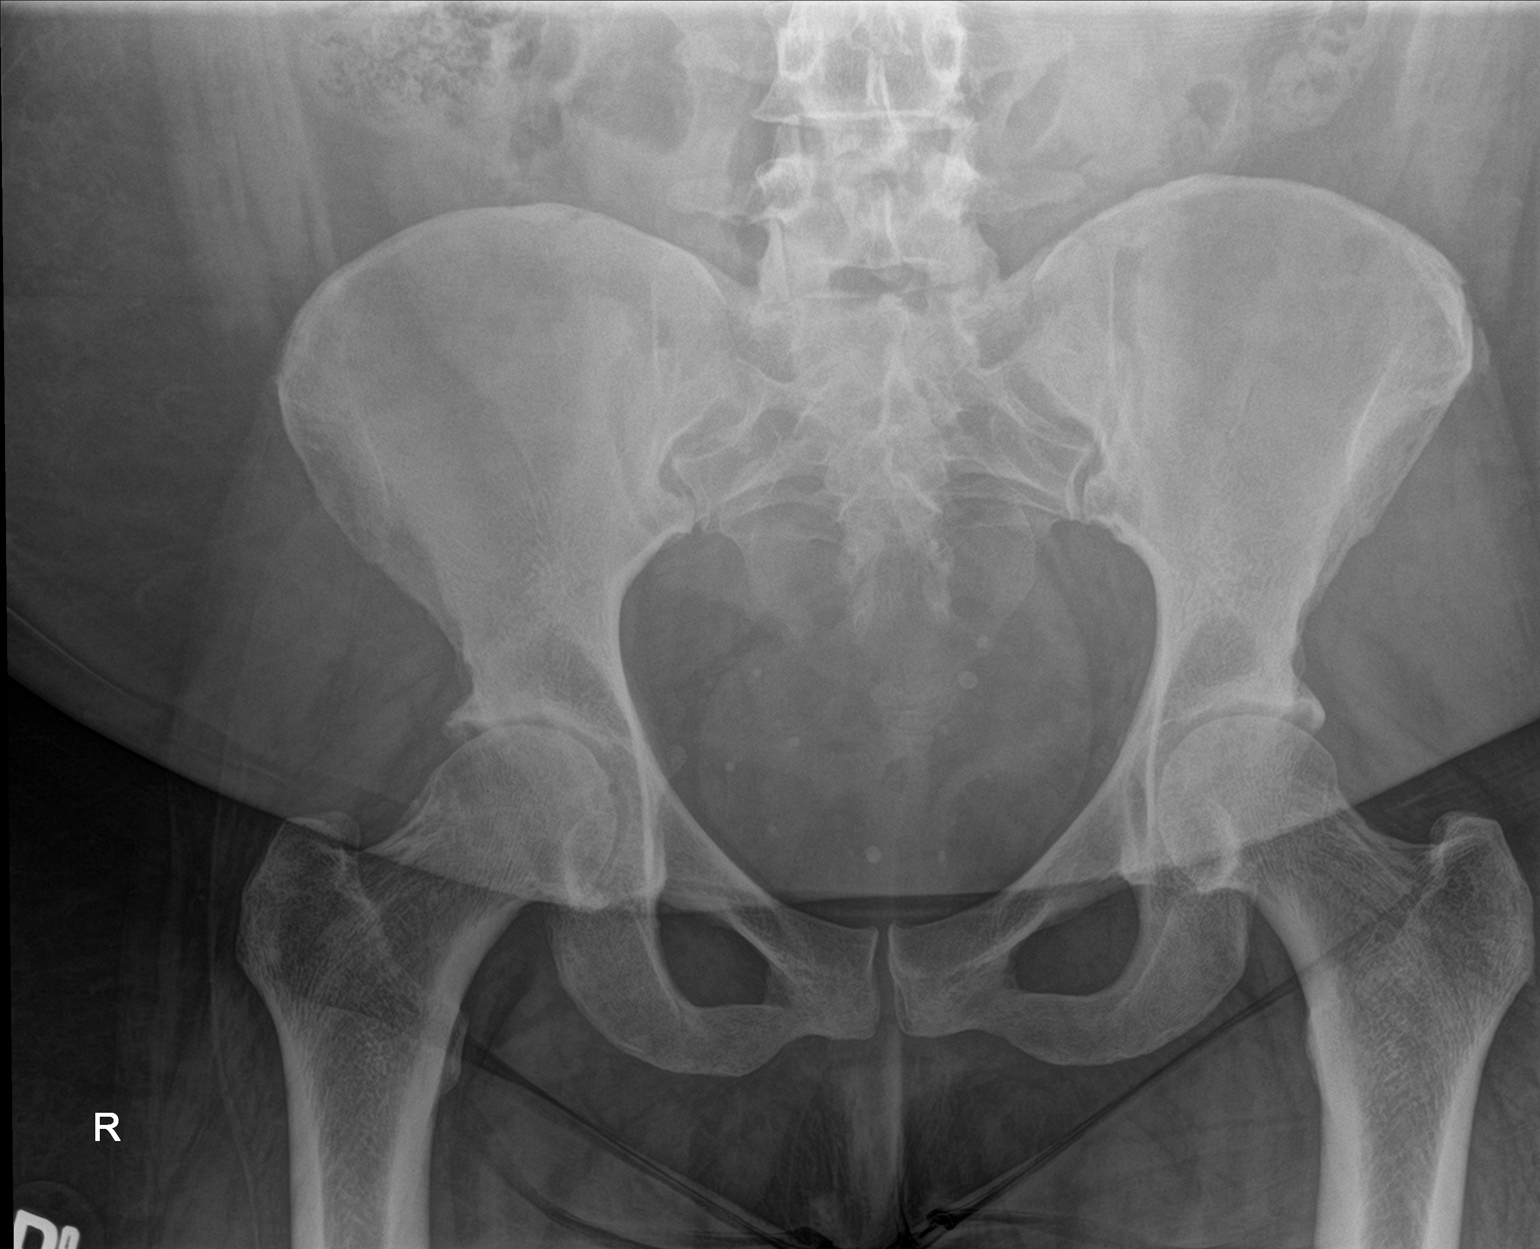

[hip ap]
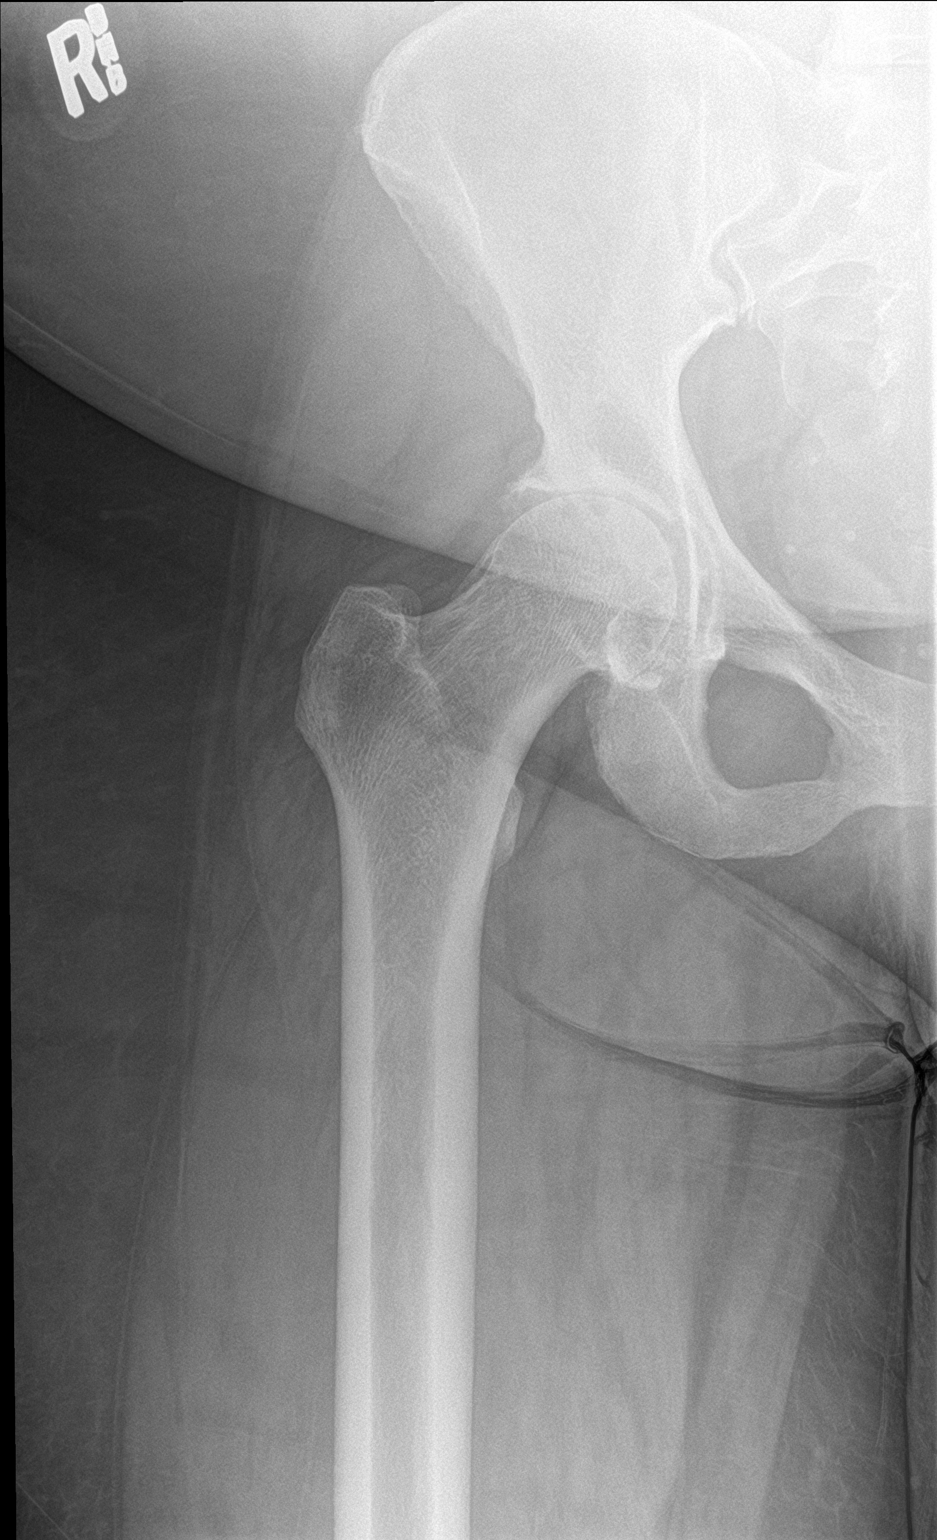

[hip lat]
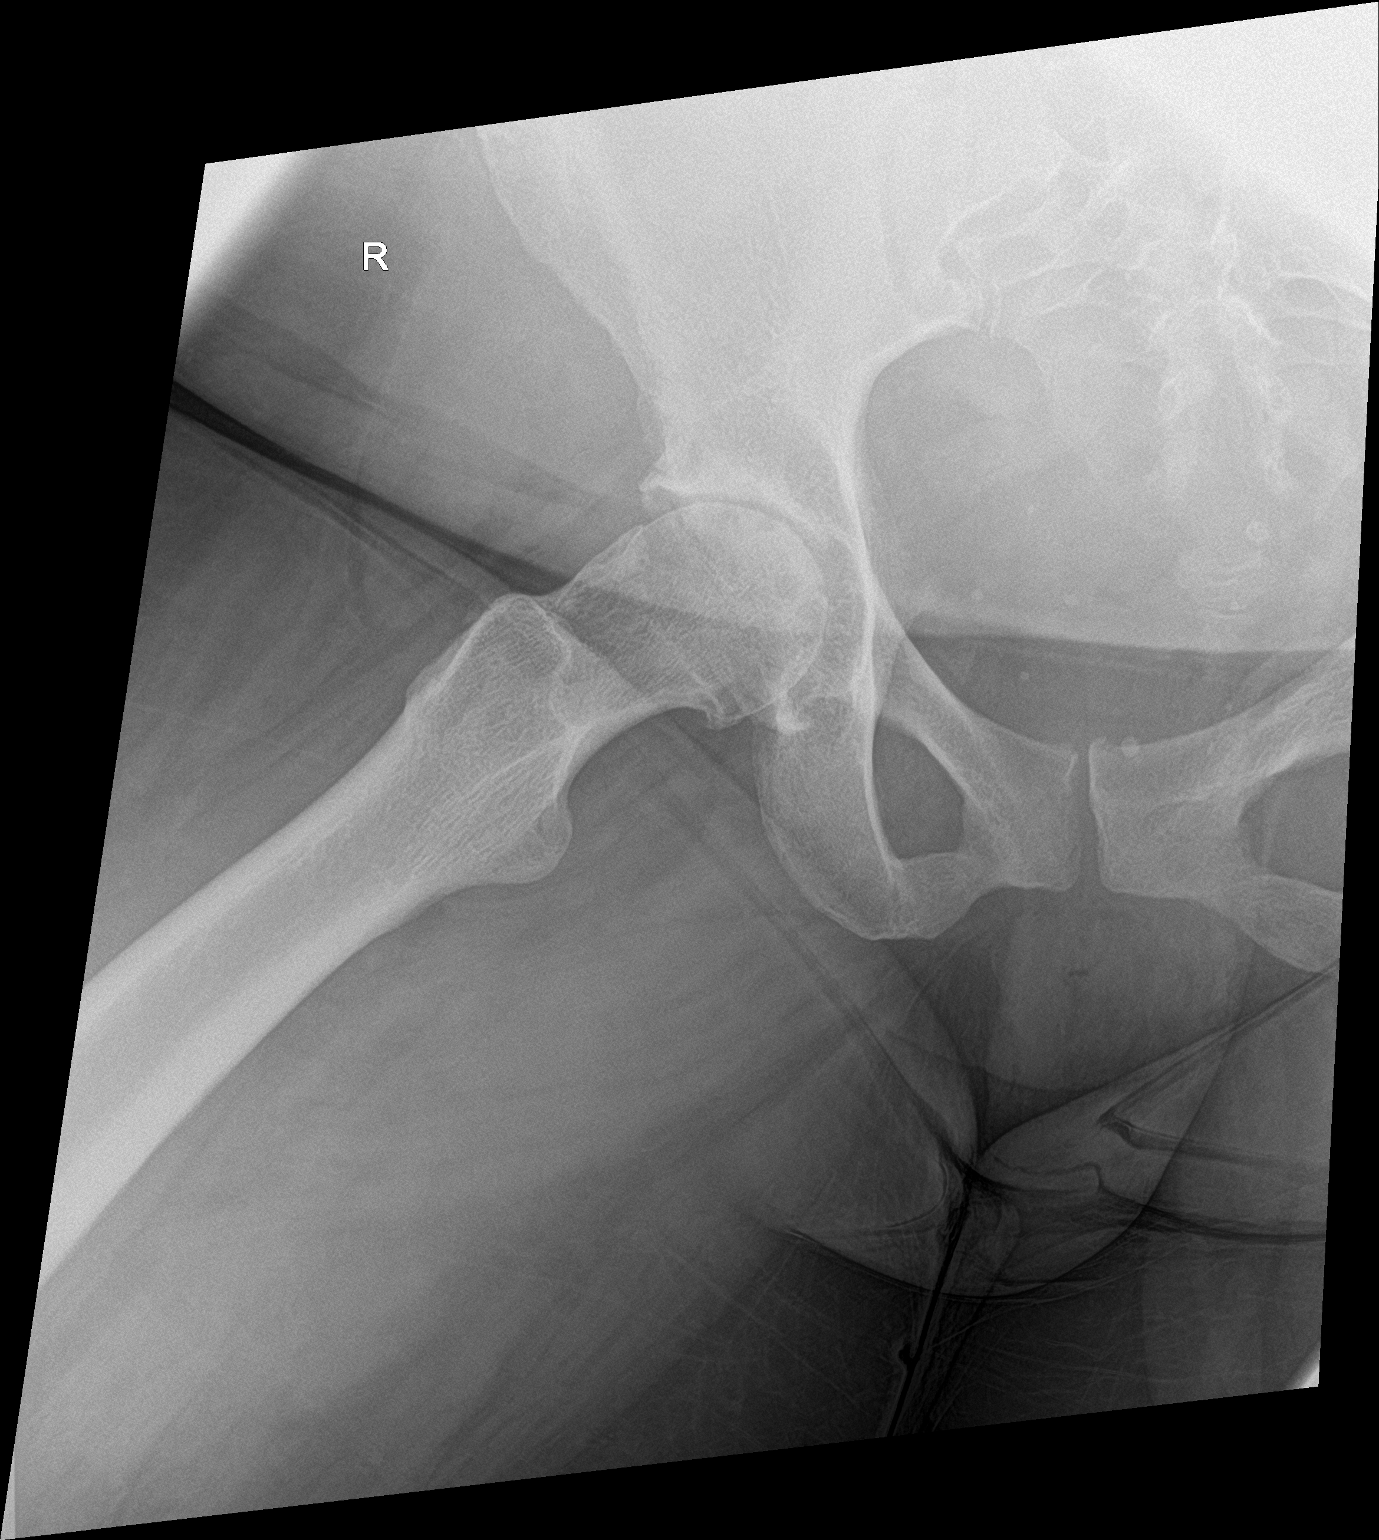

[3 of 3 positions shown; findings below may reference images not displayed]

FINDINGS: AP view the pelvis as well as AP and frog-leg views of the right hip
are provided. Osteoarthritis of both hips, right greater than left
with subchondral cystic change noted of the weight-bearing portion
of the femoral heads bilaterally. Capsular calcification is
suggested along the superolateral aspect of the right femoral neck.
Lower lumbar degenerative facet arthropathy seen at L5. No fracture
or suspicious osseous lesions.
IMPRESSION: 1. Osteoarthritis of both hips, right worse than left with
subchondral cystic change noted of the weight-bearing portion of
both femoral heads with sclerosis and right hip joint capsular
calcifications.
2. Lower lumbar facet arthropathy.
3. No acute fracture.

## 2020-06-02 ENCOUNTER — Encounter: Payer: Self-pay | Admitting: Emergency Medicine

## 2020-06-02 ENCOUNTER — Emergency Department
Admission: EM | Admit: 2020-06-02 | Discharge: 2020-06-02 | Disposition: A | Discharge status: Home / Self Care | Payer: Self-pay | Attending: Emergency Medicine | Admitting: Emergency Medicine

## 2020-06-02 ENCOUNTER — Other Ambulatory Visit: Payer: Self-pay

## 2020-06-02 DIAGNOSIS — Z79899 Other long term (current) drug therapy: Secondary | ICD-10-CM | POA: Insufficient documentation

## 2020-06-02 DIAGNOSIS — H6502 Acute serous otitis media, left ear: Secondary | ICD-10-CM

## 2020-06-02 DIAGNOSIS — I10 Essential (primary) hypertension: Secondary | ICD-10-CM | POA: Insufficient documentation

## 2020-06-02 DIAGNOSIS — F17210 Nicotine dependence, cigarettes, uncomplicated: Secondary | ICD-10-CM | POA: Insufficient documentation

## 2020-06-02 MED ORDER — FLUTICASONE PROPIONATE 50 MCG/ACT NA SUSP: 2.0000 | Freq: Every day | NASAL | 0 refills | Status: AC

## 2020-06-02 MED ORDER — PREDNISONE 10 MG PO TABS: ORAL_TABLET | ORAL | 0 refills | Status: AC

## 2020-06-02 MED ORDER — AMOXICILLIN 875 MG PO TABS: 875.0000 mg | ORAL_TABLET | Freq: Two times a day (BID) | ORAL | 0 refills | Status: AC

## 2020-06-02 NOTE — ED Provider Notes (Signed)
[facility] Emergency Department Provider Note   ____________________________________________   Event Date/Time   First MD Initiated Contact with Patient 06/02/20 1127     (approximate)  I have reviewed the triage vital signs and the nursing notes.   HISTORY  Chief Complaint Otalgia   HPI Debra Owen is a 54 y.o. female presents to the ED with complaint of left ear pain and sensation of canal swelling for the last 2 weeks.  Patient states she is also had nasal congestion for the last 3 weeks.  She denies any fever, chills, nausea or vomiting.  There is been no change in taste or smell.  Patient admits that she has been using a Q-tip in her ear but nothing has come out.  She states that her hearing is decreased in her left ear and ear pain is worsened if she bends down.       Past Medical History:  Diagnosis Date  . Hypertension     Patient Active Problem List   Diagnosis Date Noted  . History of bilateral tubal ligation 04/01/2018    Past Surgical History:  Procedure Laterality Date  . CESAREAN SECTION    . TUBAL LIGATION      Prior to Admission medications   Medication Sig Start Date End Date Taking? Authorizing Provider  albuterol (PROVENTIL HFA;VENTOLIN HFA) 108 (90 Base) MCG/ACT inhaler Inhale 2 puffs into the lungs every 6 (six) hours as needed for wheezing or shortness of breath. 03/25/18   Evon Slack, PA-C  amoxicillin (AMOXIL) 875 MG tablet Take 1 tablet (875 mg total) by mouth 2 (two) times daily. 06/02/20   Tommi Rumps, PA-C  brompheniramine-pseudoephedrine-DM 30-2-10 MG/5ML syrup Take 10 mLs by mouth 4 (four) times daily as needed. 06/17/18   Cuthriell, Delorise Royals, PA-C  fluticasone (FLONASE) 50 MCG/ACT nasal spray Place 2 sprays into both nostrils daily. 06/02/20 06/02/21  Tommi Rumps, PA-C  hydrochlorothiazide (HYDRODIURIL) 25 MG tablet Take 1 tablet (25 mg total) by mouth daily. 06/17/18   Cuthriell, Delorise Royals,  PA-C  predniSONE (DELTASONE) 10 MG tablet Take 3 tablets once a day for 4 days 06/02/20   Tommi Rumps, PA-C  dicyclomine (BENTYL) 20 MG tablet Take 1 tablet (20 mg total) by mouth 3 (three) times daily as needed for spasms. 01/08/15 06/02/20  Myrna Blazer, MD    Allergies Morphine and related and Sulfa antibiotics  No family history on file.  Social History Social History   Tobacco Use  . Smoking status: Current Every Day Smoker    Packs/day: 0.25    Types: Cigarettes  . Smokeless tobacco: Never Used  Vaping Use  . Vaping Use: Never used  Substance Use Topics  . Alcohol use: No  . Drug use: No    Review of Systems Constitutional: No fever/chills Eyes: No visual changes. ENT: No sore throat.  Positive for left ear pain. Cardiovascular: Denies chest pain. Respiratory: Denies shortness of breath. Musculoskeletal: Negative for muscle skeletal pain. Skin: Negative for rash. Neurological: Negative for headaches, focal weakness or numbness. ____________________________________________   PHYSICAL EXAM:  VITAL SIGNS: ED Triage Vitals  Enc Vitals Group     BP 06/02/20 0954 139/79     Pulse Rate 06/02/20 0954 92     Resp 06/02/20 0954 16     Temp 06/02/20 0954 97.7 F (36.5 C)     Temp Source 06/02/20 0954 Oral     SpO2 06/02/20 0954 97 %  Weight 06/02/20 0950 (!) 340 lb (154.2 kg)     Height 06/02/20 0950 5\' 5"  (1.651 m)     Head Circumference --      Peak Flow --      Pain Score 06/02/20 0950 10     Pain Loc --      Pain Edu? --      Excl. in GC? --    Constitutional: Alert and oriented. Well appearing and in no acute distress. Eyes: Conjunctivae are normal.  Head: Atraumatic. Nose: Mild congestion/rhinnorhea.  Examination of the right EAC area is clear and TM is dull.  Left EAC with minimal cerumen present.  The outer rim of the TM is erythematous and poor light reflex was seen.  Neck: No stridor.   Hematological/Lymphatic/Immunilogical: No  cervical lymphadenopathy. Cardiovascular: Normal rate, regular rhythm. Grossly normal heart sounds.  Good peripheral circulation. Respiratory: Normal respiratory effort.  No retractions. Lungs CTAB. Gastrointestinal: Soft and nontender. No distention.   Musculoskeletal: Moves upper and lower extremities with a difficulty normal gait was noted. Neurologic:  Normal speech and language. No gross focal neurologic deficits are appreciated. No gait instability. Skin:  Skin is warm, dry and intact. No rash noted. Psychiatric: Mood and affect are normal. Speech and behavior are normal.  ____________________________________________   LABS (all labs ordered are listed, but only abnormal results are displayed)  Labs Reviewed - No data to display  PROCEDURES  Procedure(s) performed (including Critical Care):  Procedures   ____________________________________________   INITIAL IMPRESSION / ASSESSMENT AND PLAN / ED COURSE  As part of my medical decision making, I reviewed the following data within the electronic MEDICAL RECORD NUMBER Notes from prior ED visits and [place] Controlled Substance Database   54 year old female presents with complaint of left ear pain for the last 2 weeks.  Patient states she has been using itchy tip without any relief of her pain and decreased hearing.  Patient reports that pain is increased with bending forward especially to pick something up off the floor.  Left TM has poor light reflex and the outer rim is erythematous.  Patient was started on prednisone and is to continue using her Flonase.  Prescription for Amoxil 875 twice daily for 10 days was written and patient is aware that she should finish the entire course of antibiotics.  She is to follow-up with Dr. 44 who is on-call for ENT if any continued problems with her ear. ____________________________________________   FINAL CLINICAL IMPRESSION(S) / ED DIAGNOSES  Final diagnoses:  Non-recurrent acute serous  otitis media of left ear     ED Discharge Orders         Ordered    predniSONE (DELTASONE) 10 MG tablet        06/02/20 1206    amoxicillin (AMOXIL) 875 MG tablet  2 times daily        06/02/20 1206    fluticasone (FLONASE) 50 MCG/ACT nasal spray  Daily        06/02/20 1206          *Please note:  Debra Owen was evaluated in Emergency Department on 06/02/2020 for the symptoms described in the history of present illness. She was evaluated in the context of the global COVID-19 pandemic, which necessitated consideration that the patient might be at risk for infection with the SARS-CoV-2 virus that causes COVID-19. Institutional protocols and algorithms that pertain to the evaluation of patients at risk for COVID-19 are in a state of rapid change based  on information released by regulatory bodies including the CDC and federal and state organizations. These policies and algorithms were followed during the patient's care in the ED.  Some ED evaluations and interventions may be delayed as a result of limited staffing during and the pandemic.*   Note:  This document was prepared using Dragon voice recognition software and may include unintentional dictation errors.    Tommi Rumps, PA-C 06/02/20 1512    Delton [place], MD 06/02/20 [phone]

## 2020-06-02 NOTE — Discharge Instructions (Signed)
Follow-up with your primary care provider or [facility] acute care if any continued problems.  If not improving or you are left ear is worsening you will need to follow-up with Dr. Willeen Cass who is on-call for [facility].  Call make an appointment with his office.  His contact information is listed on your discharge papers.  A prescription for amoxicillin 875 mg, Flonase and prednisone was sent to your pharmacy.  Take these medications as directed and finish the entire course of antibiotics.  You may also take Tylenol with this medication if needed for ear pain however until you have finished the prednisone do not take any anti-inflammatories.

## 2020-06-02 NOTE — ED Triage Notes (Signed)
Pt reports pain to her left ear and swelling for the last 2 weeks. Pt states feels like something is in it so has been using q-tips but nothing is there.
